# Patient Record
Sex: Male | Born: 1985
Health system: Southern US, Community
[De-identification: ages and names within clinical notes are randomized; demographics above are authoritative.]

## PROBLEM LIST (undated history)

## (undated) DIAGNOSIS — M26629 Arthralgia of temporomandibular joint, unspecified side: Secondary | ICD-10-CM

## (undated) DIAGNOSIS — R112 Nausea with vomiting, unspecified: Secondary | ICD-10-CM

## (undated) DIAGNOSIS — Z9889 Other specified postprocedural states: Secondary | ICD-10-CM

## (undated) DIAGNOSIS — F419 Anxiety disorder, unspecified: Secondary | ICD-10-CM

## (undated) HISTORY — PX: HERNIA REPAIR: SHX51

## (undated) HISTORY — DX: Anxiety disorder, unspecified: F41.9

## (undated) HISTORY — DX: Arthralgia of temporomandibular joint, unspecified side: M26.629

## (undated) HISTORY — PX: TYMPANOSTOMY TUBE PLACEMENT: SHX32

---
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
## 2012-09-08 DIAGNOSIS — M26629 Arthralgia of temporomandibular joint, unspecified side: Secondary | ICD-10-CM

## 2012-09-08 HISTORY — DX: Arthralgia of temporomandibular joint, unspecified side: M26.629

## 2012-11-15 ENCOUNTER — Ambulatory Visit: Payer: Self-pay | Admitting: Internal Medicine

## 2013-01-03 ENCOUNTER — Other Ambulatory Visit (INDEPENDENT_AMBULATORY_CARE_PROVIDER_SITE_OTHER): Payer: PRIVATE HEALTH INSURANCE

## 2013-01-03 ENCOUNTER — Encounter: Payer: Self-pay | Admitting: Internal Medicine

## 2013-01-03 ENCOUNTER — Ambulatory Visit (INDEPENDENT_AMBULATORY_CARE_PROVIDER_SITE_OTHER): Payer: PRIVATE HEALTH INSURANCE | Admitting: Internal Medicine

## 2013-01-03 VITALS — BP 120/64 | HR 88 | Temp 97.8°F | Resp 16 | Ht 71.0 in | Wt 143.0 lb

## 2013-01-03 DIAGNOSIS — Z0000 Encounter for general adult medical examination without abnormal findings: Secondary | ICD-10-CM

## 2013-01-03 DIAGNOSIS — G2581 Restless legs syndrome: Secondary | ICD-10-CM

## 2013-01-03 DIAGNOSIS — M26609 Unspecified temporomandibular joint disorder, unspecified side: Secondary | ICD-10-CM | POA: Insufficient documentation

## 2013-01-03 DIAGNOSIS — A6000 Herpesviral infection of urogenital system, unspecified: Secondary | ICD-10-CM

## 2013-01-03 DIAGNOSIS — A6002 Herpesviral infection of other male genital organs: Secondary | ICD-10-CM

## 2013-01-03 DIAGNOSIS — M26629 Arthralgia of temporomandibular joint, unspecified side: Secondary | ICD-10-CM

## 2013-01-03 LAB — COMPREHENSIVE METABOLIC PANEL
ALT: 16 U/L (ref 0–53)
Alkaline Phosphatase: 57 U/L (ref 39–117)
Sodium: 141 mEq/L (ref 135–145)
Total Bilirubin: 0.7 mg/dL (ref 0.3–1.2)
Total Protein: 6.9 g/dL (ref 6.0–8.3)

## 2013-01-03 LAB — CBC WITH DIFFERENTIAL/PLATELET
Basophils Absolute: 0 10*3/uL (ref 0.0–0.1)
Eosinophils Absolute: 0.2 10*3/uL (ref 0.0–0.7)
HCT: 41.7 % (ref 39.0–52.0)
Lymphs Abs: 1.4 10*3/uL (ref 0.7–4.0)
MCHC: 34.6 g/dL (ref 30.0–36.0)
MCV: 86.5 fl (ref 78.0–100.0)
Monocytes Absolute: 0.4 10*3/uL (ref 0.1–1.0)
Platelets: 232 10*3/uL (ref 150.0–400.0)
RDW: 12.3 % (ref 11.5–14.6)

## 2013-01-03 LAB — LIPID PANEL
HDL: 65.6 mg/dL (ref >39.00)
Total CHOL/HDL Ratio: 2
Triglycerides: 85 mg/dL (ref 0.0–149.0)
VLDL: 17 mg/dL (ref 0.0–40.0)

## 2013-01-03 MED ORDER — CLONAZEPAM 1 MG PO TABS: 1.0000 mg | ORAL_TABLET | Freq: Every evening | ORAL | Status: DC | PRN

## 2013-01-03 MED ORDER — VALACYCLOVIR HCL 500 MG PO TABS: 500.0000 mg | ORAL_TABLET | Freq: Every day | ORAL | Status: DC

## 2013-01-03 NOTE — Assessment & Plan Note (Signed)
I will check his HSV titers but I feel like he does have frequent, painful outbreaks so I have advised him to take a daily dose of valtrex

## 2013-01-03 NOTE — Patient Instructions (Signed)
Health Maintenance, Males A healthy lifestyle and preventative care can promote health and wellness.  Maintain regular health, dental, and eye exams.  Eat a healthy diet. Foods like vegetables, fruits, whole grains, low-fat dairy products, and lean protein foods contain the nutrients you need without too many calories. Decrease your intake of foods high in solid fats, added sugars, and salt. Get information about a proper diet from your caregiver, if necessary.  Regular physical exercise is one of the most important things you can do for your health. Most adults should get at least 150 minutes of moderate-intensity exercise (any activity that increases your heart rate and causes you to sweat) each week. In addition, most adults need muscle-strengthening exercises on 2 or more days a week.   Maintain a healthy weight. The body mass index (BMI) is a screening tool to identify possible weight problems. It provides an estimate of body fat based on height and weight. Your caregiver can help determine your BMI, and can help you achieve or maintain a healthy weight. For adults 20 years and older:  A BMI below 18.5 is considered underweight.  A BMI of 18.5 to 24.9 is normal.  A BMI of 25 to 29.9 is considered overweight.  A BMI of 30 and above is considered obese.  Maintain normal blood lipids and cholesterol by exercising and minimizing your intake of saturated fat. Eat a balanced diet with plenty of fruits and vegetables. Blood tests for lipids and cholesterol should begin at age 20 and be repeated every 5 years. If your lipid or cholesterol levels are high, you are over 50, or you are a high risk for heart disease, you may need your cholesterol levels checked more frequently.Ongoing high lipid and cholesterol levels should be treated with medicines, if diet and exercise are not effective.  If you smoke, find out from your caregiver how to quit. If you do not use tobacco, do not start.  If you  choose to drink alcohol, do not exceed 2 drinks per day. One drink is considered to be 12 ounces (355 mL) of beer, 5 ounces (148 mL) of wine, or 1.5 ounces (44 mL) of liquor.  Avoid use of street drugs. Do not share needles with anyone. Ask for help if you need support or instructions about stopping the use of drugs.  High blood pressure causes heart disease and increases the risk of stroke. Blood pressure should be checked at least every 1 to 2 years. Ongoing high blood pressure should be treated with medicines if weight loss and exercise are not effective.  If you are 45 to 28 years old, ask your caregiver if you should take aspirin to prevent heart disease.  Diabetes screening involves taking a blood sample to check your fasting blood sugar level. This should be done once every 3 years, after age 45, if you are within normal weight and without risk factors for diabetes. Testing should be considered at a younger age or be carried out more frequently if you are overweight and have at least 1 risk factor for diabetes.  Colorectal cancer can be detected and often prevented. Most routine colorectal cancer screening begins at the age of 50 and continues through age 75. However, your caregiver may recommend screening at an earlier age if you have risk factors for colon cancer. On a yearly basis, your caregiver may provide home test kits to check for hidden blood in the stool. Use of a small camera at the end of a tube,   to directly examine the colon (sigmoidoscopy or colonoscopy), can detect the earliest forms of colorectal cancer. Talk to your caregiver about this at age 50, when routine screening begins. Direct examination of the colon should be repeated every 5 to 10 years through age 75, unless early forms of pre-cancerous polyps or small growths are found.  Hepatitis C blood testing is recommended for all people born from 1945 through 1965 and any individual with known risks for hepatitis C.  Healthy  men should no longer receive prostate-specific antigen (PSA) blood tests as part of routine cancer screening. Consult with your caregiver about prostate cancer screening.  Testicular cancer screening is not recommended for adolescents or adult males who have no symptoms. Screening includes self-exam, caregiver exam, and other screening tests. Consult with your caregiver about any symptoms you have or any concerns you have about testicular cancer.  Practice safe sex. Use condoms and avoid high-risk sexual practices to reduce the spread of sexually transmitted infections (STIs).  Use sunscreen with a sun protection factor (SPF) of 30 or greater. Apply sunscreen liberally and repeatedly throughout the day. You should seek shade when your shadow is shorter than you. Protect yourself by wearing long sleeves, pants, a wide-brimmed hat, and sunglasses year round, whenever you are outdoors.  Notify your caregiver of new moles or changes in moles, especially if there is a change in shape or color. Also notify your caregiver if a mole is larger than the size of a pencil eraser.  A one-time screening for abdominal aortic aneurysm (AAA) and surgical repair of large AAAs by sound wave imaging (ultrasonography) is recommended for ages 65 to 75 years who are current or former smokers.  Stay current with your immunizations. Document Released: 02/21/2008 Document Revised: 11/17/2011 Document Reviewed: 01/20/2011 [employer] Patient Information 2013 [employer].  

## 2013-01-03 NOTE — Assessment & Plan Note (Signed)
Continue klonopin 

## 2013-01-03 NOTE — Assessment & Plan Note (Signed)
Exam done Vaccines were reviewed Labs ordered Pt ed material was given 

## 2013-01-03 NOTE — Assessment & Plan Note (Signed)
He will continue aleve as needed

## 2013-01-03 NOTE — Progress Notes (Signed)
Subjective:    Patient ID: Evan Lee, male    DOB: 03/08/1986, 27 y.o.   MRN: [id number]  HPI  New to me he comes in for a physical. He has a history of restless legs syndrome and bruxism and he requests a refill on klonopin, he has been seeing a dentist and he plans to get a nightguard made soon. He sometimes awakens with aching in his right TMJ for which he takes aleve and that helps a lot. He also tells me about a 2-3 years history of painful rash in his groin that he suspects his HSV infection. He has outbreaks as frequently as monthly and he tells me that the outbreaks are quite painful.  Review of Systems  Constitutional: Negative.  Negative for fever, chills, diaphoresis, activity change, appetite change, fatigue and unexpected weight change.  HENT: Negative.   Eyes: Negative.   Respiratory: Negative.   Cardiovascular: Negative.   Gastrointestinal: Negative.  Negative for abdominal pain.  Endocrine: Negative.   Genitourinary: Negative.  Negative for dysuria, decreased urine volume, discharge, scrotal swelling, difficulty urinating, genital sores, penile pain and testicular pain.  Musculoskeletal: Negative.   Skin: Negative.  Negative for color change, pallor, rash and wound.  Allergic/Immunologic: Negative.   Neurological: Negative.   Hematological: Negative.  Negative for adenopathy. Does not bruise/bleed easily.  Psychiatric/Behavioral: Positive for sleep disturbance. Negative for suicidal ideas, hallucinations, behavioral problems, confusion, self-injury, dysphoric mood, decreased concentration and agitation. The patient is nervous/anxious. The patient is not hyperactive.        Objective:   Physical Exam  Vitals reviewed. Constitutional: He is oriented to person, place, and time. He appears well-developed and well-nourished. No distress.  HENT:  Head: Normocephalic and atraumatic.  Mouth/Throat: Oropharynx is clear and moist. No oropharyngeal exudate.  Eyes:  Conjunctivae are normal. Right eye exhibits no discharge. Left eye exhibits no discharge. No scleral icterus.  Neck: Normal range of motion. Neck supple. No JVD present. No tracheal deviation present. No thyromegaly present.  Cardiovascular: Normal rate, regular rhythm, normal heart sounds and intact distal pulses.  Exam reveals no gallop and no friction rub.   No murmur heard. Pulmonary/Chest: Effort normal and breath sounds normal. No stridor. No respiratory distress. He has no wheezes. He has no rales. He exhibits no tenderness.  Abdominal: Soft. Bowel sounds are normal. He exhibits no distension and no mass. There is no tenderness. There is no rebound and no guarding. Hernia confirmed negative in the right inguinal area and confirmed negative in the left inguinal area.  Genitourinary: Testes normal and penis normal. Right testis shows no mass, no swelling and no tenderness. Right testis is descended. Left testis shows no mass, no swelling and no tenderness. Left testis is descended. Circumcised. No penile erythema or penile tenderness. No discharge found.  Musculoskeletal: Normal range of motion. He exhibits no edema and no tenderness.  Lymphadenopathy:    He has no cervical adenopathy.       Right: No inguinal adenopathy present.       Left: No inguinal adenopathy present.  Neurological: He is oriented to person, place, and time.  Skin: Skin is warm and dry. No rash noted. He is not diaphoretic. No erythema. No pallor.  Psychiatric: He has a normal mood and affect. His behavior is normal. Judgment and thought content normal.     No results found for this basename: WBC, HGB, HCT, PLT, GLUCOSE, CHOL, TRIG, HDL, LDLDIRECT, LDLCALC, ALT, AST, NA, K, CL, CREATININE, BUN, CO2, TSH,  PSA, INR, GLUF, HGBA1C, MICROALBUR       Assessment & Plan:

## 2013-01-04 ENCOUNTER — Encounter: Payer: Self-pay | Admitting: Internal Medicine

## 2013-04-24 ENCOUNTER — Encounter: Payer: Self-pay | Admitting: Internal Medicine

## 2013-04-25 ENCOUNTER — Other Ambulatory Visit: Payer: Self-pay | Admitting: Internal Medicine

## 2013-04-25 ENCOUNTER — Encounter: Payer: Self-pay | Admitting: Internal Medicine

## 2013-04-25 DIAGNOSIS — A6002 Herpesviral infection of other male genital organs: Secondary | ICD-10-CM

## 2013-04-25 MED ORDER — VALACYCLOVIR HCL 1 G PO TABS: 1000.0000 mg | ORAL_TABLET | Freq: Three times a day (TID) | ORAL | Status: AC

## 2013-06-17 ENCOUNTER — Encounter: Payer: Self-pay | Admitting: Internal Medicine

## 2013-06-29 ENCOUNTER — Ambulatory Visit: Payer: Self-pay | Admitting: Internal Medicine

## 2013-07-11 ENCOUNTER — Encounter: Payer: Self-pay | Admitting: Internal Medicine

## 2013-07-14 ENCOUNTER — Other Ambulatory Visit: Payer: Self-pay

## 2013-08-03 ENCOUNTER — Telehealth: Payer: Self-pay | Admitting: *Deleted

## 2013-08-03 NOTE — Telephone Encounter (Signed)
Spoke with pt and pharmacy advised of MDs message

## 2013-08-03 NOTE — Telephone Encounter (Signed)
[facility] requesting Clonazepam Refill.  Please advise

## 2013-08-03 NOTE — Telephone Encounter (Signed)
He needs to be seen for this

## 2013-11-13 ENCOUNTER — Encounter: Payer: Self-pay | Admitting: Internal Medicine

## 2013-11-14 ENCOUNTER — Other Ambulatory Visit: Payer: Self-pay | Admitting: Internal Medicine

## 2013-11-14 MED ORDER — SILVER SULFADIAZINE 1 % EX CREA: 1.0000 "application " | TOPICAL_CREAM | Freq: Every day | CUTANEOUS | Status: DC

## 2013-11-17 ENCOUNTER — Encounter: Payer: Self-pay | Admitting: Internal Medicine

## 2013-11-17 ENCOUNTER — Other Ambulatory Visit: Payer: Self-pay | Admitting: Internal Medicine

## 2013-11-17 MED ORDER — SILVER SULFADIAZINE 1 % EX CREA: 1.0000 "application " | TOPICAL_CREAM | Freq: Every day | CUTANEOUS | Status: DC

## 2015-07-12 ENCOUNTER — Emergency Department (HOSPITAL_COMMUNITY)
Admission: EM | Admit: 2015-07-12 | Discharge: 2015-07-12 | Disposition: A | Discharge status: Home / Self Care | Payer: 59 | Source: Home / Self Care | Attending: Family Medicine | Admitting: Family Medicine

## 2015-07-12 ENCOUNTER — Encounter (HOSPITAL_COMMUNITY): Payer: Self-pay | Admitting: *Deleted

## 2015-07-12 DIAGNOSIS — J069 Acute upper respiratory infection, unspecified: Secondary | ICD-10-CM | POA: Diagnosis not present

## 2015-07-12 MED ORDER — DOXYCYCLINE HYCLATE 100 MG PO CAPS: 100.0000 mg | ORAL_CAPSULE | Freq: Two times a day (BID) | ORAL | Status: DC

## 2015-07-12 MED ORDER — IPRATROPIUM BROMIDE 0.06 % NA SOLN: 2.0000 | Freq: Four times a day (QID) | NASAL | Status: DC

## 2015-07-12 NOTE — ED Notes (Signed)
Pt  Reports   Symptoms   Of  Cough  /   Congestion             he  Has    A  Cough  And  Is  Hoarse  The     Reports  The  Symptoms for  About  1  Week       He  Reports  The  Symptoms  Not  releived  By  [facility]  meds

## 2015-07-12 NOTE — ED Provider Notes (Signed)
CSN: [id number]     Arrival date & time 07/12/15  1307 History   First MD Initiated Contact with Patient 07/12/15 1432     Chief Complaint  Patient presents with  . URI   (Consider location/radiation/quality/duration/timing/severity/associated sxs/prior Treatment) Patient is a 29 y.o. male presenting with cough. The history is provided by the patient.  Cough Cough characteristics:  Productive Sputum characteristics:  Yellow and green Severity:  Mild Onset quality:  Gradual Duration:  1 week Progression:  Unchanged Chronicity:  New Smoker: no   Context: upper respiratory infection and weather changes   Relieved by:  None tried Worsened by:  Nothing tried Associated symptoms: rhinorrhea   Associated symptoms: no chills, no fever, no rash, no shortness of breath, no sinus congestion and no sore throat     Past Medical History  Diagnosis Date  . TMJ arthralgia 2014    + bruxism per his dentist  . RLS (restless legs syndrome) 2012    treated with klonopin  . HSV-2 infection 2011   Past Surgical History  Procedure Laterality Date  . Anterior cruciate ligament repair  2002   Family History  Problem Relation Age of Onset  . Arthritis Other   . Hypertension Other   . Hypertension Father   . Cancer Neg Hx   . Diabetes Neg Hx   . Early death Neg Hx   . Drug abuse Neg Hx   . Heart disease Neg Hx   . Hyperlipidemia Neg Hx   . Kidney disease Neg Hx   . Stroke Neg Hx    Social History  Substance Use Topics  . Smoking status: Never Smoker   . Smokeless tobacco: None  . Alcohol Use: 3.0 oz/week    5 Cans of beer per week    Review of Systems  Constitutional: Negative for fever and chills.  HENT: Positive for congestion and rhinorrhea. Negative for sore throat.   Respiratory: Positive for cough. Negative for shortness of breath.   Cardiovascular: Negative.   Genitourinary: Negative.   Musculoskeletal: Negative.   Skin: Negative.  Negative for rash.    Allergies   Review of patient's allergies indicates no known allergies.  Home Medications   Prior to Admission medications   Medication Sig Start Date End Date Taking? Authorizing Provider  clonazePAM (KLONOPIN) 1 MG tablet Take 1 tablet (1 mg total) by mouth at bedtime as needed for anxiety. 01/03/13   Janith Lima, MD  doxycycline (VIBRAMYCIN) 100 MG capsule Take 1 capsule (100 mg total) by mouth 2 (two) times daily. 07/12/15   Billy Fischer, MD  ipratropium (ATROVENT) 0.06 % nasal spray Place 2 sprays into both nostrils 4 (four) times daily. 07/12/15   Billy Fischer, MD  silver sulfADIAZINE (SILVADENE) 1 % cream Apply 1 application topically daily. 11/17/13   Janith Lima, MD   Meds Ordered and Administered this Visit  Medications - No data to display  There were no vitals taken for this visit. No data found.   Physical Exam  Constitutional: He is oriented to person, place, and time. He appears well-developed and well-nourished. No distress.  HENT:  Right Ear: External ear normal.  Left Ear: External ear normal.  Nose: Rhinorrhea present.  Mouth/Throat: Oropharynx is clear and moist.  Neck: Normal range of motion. Neck supple.  Cardiovascular: Normal rate, regular rhythm, normal heart sounds and intact distal pulses.   Pulmonary/Chest: Effort normal and breath sounds normal.  Lymphadenopathy:    He has no  cervical adenopathy.  Neurological: He is alert and oriented to person, place, and time.  Skin: Skin is warm and dry.  Nursing note and vitals reviewed.   ED Course  Procedures (including critical care time)  Labs Review Labs Reviewed - No data to display  Imaging Review No results found.   Visual Acuity Review  Right Eye Distance:   Left Eye Distance:   Bilateral Distance:    Right Eye Near:   Left Eye Near:    Bilateral Near:         MDM   1. URI (upper respiratory infection)        Billy Fischer, MD 07/20/15 2046

## 2015-07-13 ENCOUNTER — Encounter (HOSPITAL_COMMUNITY): Payer: Self-pay | Admitting: *Deleted

## 2016-02-07 DIAGNOSIS — J3089 Other allergic rhinitis: Secondary | ICD-10-CM | POA: Insufficient documentation

## 2016-03-26 ENCOUNTER — Ambulatory Visit (INDEPENDENT_AMBULATORY_CARE_PROVIDER_SITE_OTHER): Payer: BLUE CROSS/BLUE SHIELD | Admitting: Family Medicine

## 2016-03-26 ENCOUNTER — Encounter: Payer: Self-pay | Admitting: Family Medicine

## 2016-03-26 VITALS — BP 120/76 | HR 69 | Ht 71.0 in | Wt 149.0 lb

## 2016-03-26 DIAGNOSIS — A6002 Herpesviral infection of other male genital organs: Secondary | ICD-10-CM

## 2016-03-26 DIAGNOSIS — R7301 Impaired fasting glucose: Secondary | ICD-10-CM

## 2016-03-26 DIAGNOSIS — Z23 Encounter for immunization: Secondary | ICD-10-CM | POA: Diagnosis not present

## 2016-03-26 DIAGNOSIS — G2581 Restless legs syndrome: Secondary | ICD-10-CM

## 2016-03-26 DIAGNOSIS — A609 Anogenital herpesviral infection, unspecified: Secondary | ICD-10-CM

## 2016-03-26 DIAGNOSIS — H6981 Other specified disorders of Eustachian tube, right ear: Secondary | ICD-10-CM

## 2016-03-26 DIAGNOSIS — F411 Generalized anxiety disorder: Secondary | ICD-10-CM

## 2016-03-26 MED ORDER — FLUTICASONE PROPIONATE 50 MCG/ACT NA SUSP
NASAL | Status: DC
Start: 2016-03-26 — End: 2017-10-13

## 2016-03-26 NOTE — Progress Notes (Signed)
Evan Lee, D.O. Primary care at [facility]:    Chief Complaint  Patient presents with  . Establish Care  . Ear Fullness   New pt, here to establish care.   HPI: Evan Lee is a pleasant 30 y.o. male who presents to [facility] today To become established. Prior PCP was Dr. Scarlette Calico.   No doc since pre-college.   Moved here 5 yrs ago.  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    5 d wk, 5am- 3pm;  7a- 3 pm.   Used to work out in college (4 yr degree)  but hasn't for some time now.  His mom lives in [place] and patient is married. Wife is pregnant at [redacted] weeks with her first child.     R ear- feels clogged/ like there is a fullness and occasionally ringing in it.   Feels like he flew and if he blows his nose, he will break the pressure in the ear.  Tries to clean it out himself, uses q-tips.   Has had restless leg syndrome for sev yrs now. Not an active issue but has taken Klonopin's in the past since 2014 only if symptoms got bad.      Past Medical History:  Diagnosis Date  . Elevated fasting glucose 03/26/2016  . Herpes genitalis in men 01/03/2013  . TMJ arthralgia 2014   + bruxism per his dentist      Past Surgical History:  Procedure Laterality Date  . ANTERIOR CRUCIATE LIGAMENT REPAIR  2002      Family History  Problem Relation Age of Onset  . Arthritis Other   . Hypertension Other   . Hypertension Father   . Cancer Neg Hx   . Early death Neg Hx   . Drug abuse Neg Hx   . Heart disease Neg Hx   . Hyperlipidemia Neg Hx   . Kidney disease Neg Hx   . Stroke Neg Hx   . Diabetes Maternal Grandmother       History  Drug Use No     History  Alcohol Use  . 3.0 oz/week  . 5 Cans of beer per week     History  Smoking Status  . Former Smoker  . Packs/day: 0.05  . Years: 1.00  Smokeless Tobacco  . Never Used       History  Sexual Activity  .  Sexual activity: Yes      Patient's Medications  New Prescriptions   FLUTICASONE (FLONASE) 50 MCG/ACT NASAL SPRAY    1 spray each nares twice daily or 2 sprays each nostril once daily  Previous Medications   No medications on file  Modified Medications   No medications on file  Discontinued Medications   CLONAZEPAM (KLONOPIN) 1 MG TABLET    Take 1 tablet (1 mg total) by mouth at bedtime as needed for anxiety.   DOXYCYCLINE (VIBRAMYCIN) 100 MG CAPSULE    Take 1 capsule (100 mg total) by mouth 2 (two) times daily.   IPRATROPIUM (ATROVENT) 0.06 % NASAL SPRAY    Place 2 sprays into both nostrils 4 (four) times daily.   SILVER SULFADIAZINE (SILVADENE) 1 % CREAM    Apply 1 application topically daily.     Review of patient's allergies indicates no known allergies. Outpatient Encounter Prescriptions as of 03/26/2016  Medication Sig  . fluticasone (FLONASE) 50  MCG/ACT nasal spray 1 spray each nares twice daily or 2 sprays each nostril once daily  . [DISCONTINUED] clonazePAM (KLONOPIN) 1 MG tablet Take 1 tablet (1 mg total) by mouth at bedtime as needed for anxiety.  . [DISCONTINUED] doxycycline (VIBRAMYCIN) 100 MG capsule Take 1 capsule (100 mg total) by mouth 2 (two) times daily.  . [DISCONTINUED] ipratropium (ATROVENT) 0.06 % nasal spray Place 2 sprays into both nostrils 4 (four) times daily.  . [DISCONTINUED] silver sulfADIAZINE (SILVADENE) 1 % cream Apply 1 application topically daily.   No facility-administered encounter medications on file as of 03/26/2016.       There are no preventive care reminders to display for this patient.   Immunization History  Administered Date(s) Administered  . Tdap 04/16/2005, 03/26/2016     <no information>   No flowsheet data found.   No flowsheet data found.    Review of Systems:   ( Completed via Adult Medical History Intake form today ) General:   Denies fever, chills, appetite changes, unexplained weight loss.  Optho/Auditory:    Denies visual changes, blurred vision/LOV, ringing in ears/ diff hearing Respiratory:   Denies SOB, DOE, cough, wheezing.  Cardiovascular:   Denies chest pain, palpitations, new onset peripheral edema  Gastrointestinal:   Denies nausea, vomiting, diarrhea.  Genitourinary:    Denies dysuria, increased frequency, flank pain.  Endocrine:     Denies hot or cold intolerance, polyuria, polydipsia. Musculoskeletal:  Denies unexplained myalgias, joint swelling, arthralgias, gait problems.  Skin:  Denies rash, suspicious lesions or new/ changes in moles Neurological:    Denies dizziness, syncope, unexplained weakness, lightheadedness, numbness  Psychiatric/Behavioral:   Denies mood changes, suicidal or homicidal ideations, hallucinations    Objective:   Blood pressure 145/97, pulse 69, height 5\' 11"  (1.803 m), weight 149 lb (67.586 kg), SpO2 100 %. Body mass index is 20.78 kg/m.  General: Well Developed, well nourished, and in no acute distress.  Neuro: Alert and oriented x3, extra-ocular muscles intact, sensation grossly intact.  HEENT: [place]/AT, no LAD, right TM mild bulge, no effusion, EAC clear and TM translucent, no TTP sinuses, Nares- patent but mild erythema and edematous on right. Skin: no gross suspicious lesions or rashes  Cardiac: Regular rate and rhythm, no murmurs rubs or gallops.  Respiratory: Essentially clear to auscultation bilaterally. Not using accessory muscles, speaking in full sentences.  Abdominal: Soft, not grossly distended Musculoskeletal: Ambulates w/o diff, FROM * 4 ext.  Vasc: less 2 sec cap RF, warm and pink  Psych:  No HI/SI, judgement and insight good.    Impression and Recommendations:      1. ETD (eustachian tube dysfunction), right   2. RLS (restless legs syndrome)   3. Elevated fasting glucose   4. h/o Anxiety state w diff sleeping   5. Herpes genitalis in men   6. Need for Tdap vaccination    The patient was counseled, risk factors were discussed,  anticipatory guidance given.  - ETD- handouts provided after discussion, all patient's questions answered. Flonase daily, may use Milta Deiters med sinus rinse twice a day, especially after working in [employer] with all the loose flour/allergens in the air  - He declines the need for medication refills at this time.  - Told to make office visit for fasting blood work near future.     Meds ordered this encounter  Medications  . fluticasone (FLONASE) 50 MCG/ACT nasal spray    Sig: 1 spray each nares twice daily or 2 sprays each nostril  once daily    Dispense:  1 g    Refill:  2    Gross side effects, risk and benefits, and alternatives of medications discussed with patient.  Patient is aware that all medications have potential side effects and we are unable to predict every side effect or drug-drug interaction that may occur.  Expresses verbal understanding and consents to current therapy plan and treatment regimen.  Please see AVS handed out to patient at the end of our visit for further patient instructions/ counseling done pertaining to today's office visit.  Orders Placed This Encounter  Procedures  . Tdap vaccine greater than or equal to 7yo IM  . CBC with Differential/Platelet    Standing Status:   Future    Standing Expiration Date:   03/26/2017  . COMPLETE METABOLIC PANEL WITH GFR    Standing Status:   Future    Standing Expiration Date:   03/26/2017  . Lipid panel    Standing Status:   Future    Standing Expiration Date:   03/26/2017    Order Specific Question:   Has the patient fasted?    Answer:   Yes  . Magnesium    Standing Status:   Future    Standing Expiration Date:   03/26/2017  . Phosphorus    Standing Status:   Future    Standing Expiration Date:   03/26/2017  . VITAMIN D 25 Hydroxy (Vit-D Deficiency, Fractures)    Standing Status:   Future    Standing Expiration Date:   03/26/2017  . Hemoglobin A1c    Standing Status:   Future    Standing Expiration Date:   03/26/2017  .  TSH    Standing Status:   Future    Standing Expiration Date:   03/26/2017     Note: This document was prepared using Dragon voice recognition software and may include unintentional dictation errors.

## 2016-03-26 NOTE — Patient Instructions (Signed)
Evan Lee med sinus rinses twice daily. Especially if you're in a dusty situation or with a lot of pollen or outdoors for long period time when you get inside make sure you flush her nose out.     Fluticasone nasal spray What is this medicine? FLUTICASONE (floo TIK a sone) is a corticosteroid. This medicine is used to treat the symptoms of allergies like sneezing, itchy red eyes, and itchy, runny, or stuffy nose. This medicine may be used for other purposes; ask your health care provider or pharmacist if you have questions. What should I tell my health care provider before I take this medicine? They need to know if you have any of these conditions: -infection, like tuberculosis, herpes, or fungal infection -recent surgery on nose or sinuses -taking corticosteroid by mouth -an unusual or allergic reaction to fluticasone, steroids, other medicines, foods, dyes, or preservatives -pregnant or trying to get pregnant -breast-feeding How should I use this medicine? This medicine is for use in the nose. Follow the directions on your product or prescription label. This medicine works best if used at regular intervals. Do not use more often than directed. Make sure that you are using your nasal spray correctly. After 6 months of daily use without a prescription, talk to your doctor or health care professional before using it for a longer time. Ask your doctor or health care professional if you have any questions. Talk to your pediatrician regarding the use of this medicine in children. Special care may be needed. This medicine has been used in children as young as 2 years. After two months of daily use without a prescription in a child, talk to your pediatrician before using it for a longer time. Overdosage: If you think you have taken too much of this medicine contact a poison control center or emergency room at once. NOTE: This medicine is only for you. Do not share this medicine with others. What if I miss  a dose? If you miss a dose, use it as soon as you remember. If it is almost time for your next dose, use only that dose and continue with your regular schedule. Do not use double or extra doses. What may interact with this medicine? -ketoconazole -metyrapone -some medicines for HIV -vaccines This list may not describe all possible interactions. Give your health care provider a list of all the medicines, herbs, non-prescription drugs, or dietary supplements you use. Also tell them if you smoke, drink alcohol, or use illegal drugs. Some items may interact with your medicine. What should I watch for while using this medicine? Visit your doctor or health care professional for regular checks on your progress. Some symptoms may improve within 12 hours after starting use. Check with your doctor or health care professional if there is no improvement in your condition after 3 weeks of use. Do not come in contact with people who have chickenpox or the measles while you are taking this medicine. If you do, call your doctor right away. What side effects may I notice from receiving this medicine? Side effects that you should report to your doctor or health care professional as soon as possible: -allergic reactions like skin rash, itching or hives, swelling of the face, lips, or tongue -changes in vision -flu-like symptoms -white patches or sores in the mouth or nose Side effects that usually do not require medical attention (report to your doctor or health care professional if they continue or are bothersome): -burning or irritation inside the nose or  throat -cough -headache -nosebleed -unusual taste or smell This list may not describe all possible side effects. Call your doctor for medical advice about side effects. You may report side effects to FDA at 1-800-FDA-1088. Where should I keep my medicine? Keep out of the reach of children. Store at room temperature between 15 and 30 degrees C (59 and 86  degrees F). Throw away any unused medicine after the expiration date. NOTE: This sheet is a summary. It may not cover all possible information. If you have questions about this medicine, talk to your doctor, pharmacist, or health care provider.    2016, Elsevier/Gold Standard. (2014-01-18 09:07:53)

## 2016-03-30 ENCOUNTER — Encounter: Payer: Self-pay | Admitting: Family Medicine

## 2016-03-30 DIAGNOSIS — F411 Generalized anxiety disorder: Secondary | ICD-10-CM | POA: Insufficient documentation

## 2016-03-31 ENCOUNTER — Other Ambulatory Visit (INDEPENDENT_AMBULATORY_CARE_PROVIDER_SITE_OTHER): Payer: BLUE CROSS/BLUE SHIELD

## 2016-03-31 DIAGNOSIS — R7301 Impaired fasting glucose: Secondary | ICD-10-CM

## 2016-03-31 DIAGNOSIS — Z23 Encounter for immunization: Secondary | ICD-10-CM

## 2016-03-31 DIAGNOSIS — H6981 Other specified disorders of Eustachian tube, right ear: Secondary | ICD-10-CM

## 2016-03-31 DIAGNOSIS — G2581 Restless legs syndrome: Secondary | ICD-10-CM

## 2016-04-01 LAB — COMPLETE METABOLIC PANEL WITH GFR
ALT: 10 U/L (ref 9–46)
AST: 16 U/L (ref 10–40)
Albumin: 4.5 g/dL (ref 3.6–5.1)
Alkaline Phosphatase: 59 U/L (ref 40–115)
BUN: 14 mg/dL (ref 7–25)
CHLORIDE: 101 mmol/L (ref 98–110)
CO2: 27 mmol/L (ref 20–31)
Calcium: 9.4 mg/dL (ref 8.6–10.3)
Creat: 1 mg/dL (ref 0.60–1.35)
GFR, Est African American: >89 mL/min (ref >=60)
GLUCOSE: 85 mg/dL (ref 65–99)
POTASSIUM: 4.1 mmol/L (ref 3.5–5.3)
SODIUM: 139 mmol/L (ref 135–146)
Total Bilirubin: 0.7 mg/dL (ref 0.2–1.2)
Total Protein: 6.7 g/dL (ref 6.1–8.1)

## 2016-04-01 LAB — CBC WITH DIFFERENTIAL/PLATELET
BASOS ABS: 58 {cells}/uL (ref 0–200)
Basophils Relative: 1 %
EOS PCT: 6 %
Eosinophils Absolute: 348 cells/uL (ref 15–500)
HCT: 41.2 % (ref 38.5–50.0)
Hemoglobin: 13.9 g/dL (ref 13.2–17.1)
Lymphocytes Relative: 31 %
Lymphs Abs: 1798 cells/uL (ref 850–3900)
MCH: 28.7 pg (ref 27.0–33.0)
MCHC: 33.7 g/dL (ref 32.0–36.0)
MCV: 85.1 fL (ref 80.0–100.0)
MONOS PCT: 8 %
MPV: 9.9 fL (ref 7.5–12.5)
Monocytes Absolute: 464 cells/uL (ref 200–950)
NEUTROS ABS: 3132 {cells}/uL (ref 1500–7800)
NEUTROS PCT: 54 %
PLATELETS: 251 10*3/uL (ref 140–400)
RBC: 4.84 MIL/uL (ref 4.20–5.80)
RDW: 13.1 % (ref 11.0–15.0)
WBC: 5.8 10*3/uL (ref 3.8–10.8)

## 2016-04-01 LAB — HEMOGLOBIN A1C
Hgb A1c MFr Bld: 5.2 % (ref <5.7)
MEAN PLASMA GLUCOSE: 103 mg/dL

## 2016-04-01 LAB — LIPID PANEL
CHOL/HDL RATIO: 2.1 ratio (ref <=5.0)
Cholesterol: 147 mg/dL (ref 125–200)
HDL: 71 mg/dL (ref >=40)
LDL CALC: 66 mg/dL (ref <130)
Triglycerides: 51 mg/dL (ref <150)
VLDL: 10 mg/dL (ref <30)

## 2016-04-01 LAB — TSH: TSH: 2.66 mIU/L (ref 0.40–4.50)

## 2016-04-01 LAB — PHOSPHORUS: Phosphorus: 3.9 mg/dL (ref 2.5–4.5)

## 2016-04-01 LAB — VITAMIN D 25 HYDROXY (VIT D DEFICIENCY, FRACTURES): Vit D, 25-Hydroxy: 37 ng/mL (ref 30–100)

## 2016-04-01 LAB — MAGNESIUM: MAGNESIUM: 1.8 mg/dL (ref 1.5–2.5)

## 2016-04-10 ENCOUNTER — Encounter: Payer: Self-pay | Admitting: Family Medicine

## 2016-04-10 ENCOUNTER — Ambulatory Visit (INDEPENDENT_AMBULATORY_CARE_PROVIDER_SITE_OTHER): Payer: BLUE CROSS/BLUE SHIELD | Admitting: Family Medicine

## 2016-04-10 VITALS — BP 126/84 | HR 69 | Wt 150.1 lb

## 2016-04-10 DIAGNOSIS — J3089 Other allergic rhinitis: Secondary | ICD-10-CM | POA: Diagnosis not present

## 2016-04-10 DIAGNOSIS — F411 Generalized anxiety disorder: Secondary | ICD-10-CM | POA: Diagnosis not present

## 2016-04-10 DIAGNOSIS — G2581 Restless legs syndrome: Secondary | ICD-10-CM

## 2016-04-10 DIAGNOSIS — R7301 Impaired fasting glucose: Secondary | ICD-10-CM

## 2016-04-10 MED ORDER — MELATONIN 3 MG SL SUBL: 1.0000 | SUBLINGUAL_TABLET | Freq: Every evening | SUBLINGUAL | Status: DC | PRN

## 2016-04-10 NOTE — Progress Notes (Signed)
Assessment and plan:  1. Environmental and seasonal allergies   2. h/o Anxiety state w diff sleeping   3. Elevated fasting glucose   4. h/o RLS (restless legs syndrome)     h/o RLS (restless legs syndrome) Has had restless leg syndrome for sev yrs now. Not an active issue but has taken Klonopin's in the past since 2014 only if symptoms got bad.   h/o Anxiety state w diff sleeping Symptoms stable. Not currently an issue for patient. Advised to use over-the-counter melatonin sublingual in the future if becomes a problem again  Environmental and seasonal allergies Continue Flonase daily after sinus rinses twice a day  Elevated fasting glucose A1c within normal limits. Counseling on lifestyle modification and diet performed   Patient's Medications  New Prescriptions   MELATONIN 3 MG SUBL    Place 1-3 tablets under the tongue at bedtime as needed.  Previous Medications   FLUTICASONE (FLONASE) 50 MCG/ACT NASAL SPRAY    1 spray each nares twice daily or 2 sprays each nostril once daily  Modified Medications   No medications on file  Discontinued Medications   No medications on file    No Follow-up on file.  Anticipatory guidance and routine counseling done re: condition, txmnt options and need for follow up. All questions of patient's were answered.   Gross side effects, risk and benefits, and alternatives of medications discussed with patient.  Patient is aware that all medications have potential side effects and we are unable to predict every sideeffect or drug-drug interaction that may occur.  Expresses verbal understanding and consents to current therapy plan and treatment regiment.  Please see AVS handed out to patient at the end of our visit for additional patient instructions/ counseling done pertaining to today's office visit.  Note: This document was prepared using Dragon voice recognition software and  may include unintentional dictation errors.   ----------------------------------------------------------------------------------------------------------------------  Subjective:   CC: Evan Lee is a 30 y.o. male who presents to [place] at [facility] today for Follow-up and to discuss recent lab work   Patient was seen and has now started to use Flonase daily. It is helping a lot with the symptoms. He notices the greatest benefit from doing Nettie pot twice daily per our recommendations last office visit.  Reviewed all patient's blood work in full with him; all questions and concerns were answered   Wt Readings from Last 3 Encounters:  04/10/16 150 lb 1.6 oz (68.1 kg)  03/26/16 149 lb (67.6 kg)  01/03/13 143 lb (64.9 kg)   BP Readings from Last 3 Encounters:  04/10/16 126/84  03/26/16 120/76  01/03/13 120/64   Pulse Readings from Last 3 Encounters:  04/10/16 69  03/26/16 69  01/03/13 88    Full medical history updated and reviewed in the office today  Patient Active Problem List   Diagnosis Date Noted  . h/o Anxiety state w diff sleeping 03/30/2016  . Elevated fasting glucose 03/26/2016  . Environmental and seasonal allergies 02/07/2016  . Routine general medical examination at a health care facility 01/03/2013  . Herpes genitalis in men 01/03/2013  . TMJ arthralgia 01/03/2013  . h/o RLS (restless legs syndrome) 01/03/2013    Past Medical History:  Diagnosis Date  . Elevated fasting glucose 03/26/2016  . Herpes genitalis in men 01/03/2013  . TMJ arthralgia 2014   + bruxism per his dentist    Past Surgical History:  Procedure  Laterality Date  . ANTERIOR CRUCIATE LIGAMENT REPAIR  2002    Social History  Substance Use Topics  . Smoking status: Former Smoker    Packs/day: 0.05    Years: 1.00  . Smokeless tobacco: Never Used  . Alcohol use 3.0 oz/week    5 Cans of beer per week    family history includes Arthritis in his  other; Diabetes in his maternal grandmother; Hypertension in his father and other.   Medications: Current Outpatient Prescriptions  Medication Sig Dispense Refill  . fluticasone (FLONASE) 50 MCG/ACT nasal spray 1 spray each nares twice daily or 2 sprays each nostril once daily 1 g 2  . Melatonin 3 MG SUBL Place 1-3 tablets under the tongue at bedtime as needed.     No current facility-administered medications for this visit.     Allergies:  No Known Allergies   ROS:  Const:    no fevers, chills Eyes:    conjunctiva clear, no vision changes or blurred vision ENT:  no hearing difficulties, no dysphagia, no dysphonia, no nose bleeds CV:   no chest pain, arrhythmias, no orthopnea, no PND Pulm:   no SOB at rest or exertion, no Wheeze, no DIB, no hemoptysis GI:    no N/V/D/C, no abd pain GU:   no blood in urine or inc freq or urgency Heme/Onc:    no unexplained bleeding, no night sweats, no more fatigue than usual Neuro:   No dizziness, no LOC, No unexplained weakness or numbness Endo:   no unexplained wt loss or gain M-Sk:   no localized myalgias or arthralgias Psych:    No SI/HI, no memory prob or unexplained confusion    Objective:  Blood pressure 126/84, pulse 69, weight 150 lb 1.6 oz (68.1 kg). Body mass index is 20.93 kg/m. Gen: Well NAD, A and O *3 HEENT: [place]/AT, EOMI,  MMM, OP- clr Lungs: Normal work of breathing. CTA B/L, no Wh, rhonchi Heart: RRR, S1, S2 WNL's, no MRG Abd: Soft. No gross distention Exts: warm, pink,  Brisk capillary refill, warm and well perfused.    Recent Results (from the past 2160 hour(s))  CBC with Differential/Platelet     Status: None   Collection Time: 03/31/16  3:19 PM  Result Value Ref Range   WBC 5.8 3.8 - 10.8 K/uL   RBC 4.84 4.20 - 5.80 MIL/uL   Hemoglobin 13.9 13.2 - 17.1 g/dL   HCT 41.2 38.5 - 50.0 %   MCV 85.1 80.0 - 100.0 fL   MCH 28.7 27.0 - 33.0 pg   MCHC 33.7 32.0 - 36.0 g/dL   RDW 13.1 11.0 - 15.0 %   Platelets 251 140 -  400 K/uL   MPV 9.9 7.5 - 12.5 fL   Neutro Abs 3,132 1,500 - 7,800 cells/uL   Lymphs Abs 1,798 850 - 3,900 cells/uL   Monocytes Absolute 464 200 - 950 cells/uL   Eosinophils Absolute 348 15 - 500 cells/uL   Basophils Absolute 58 0 - 200 cells/uL   Neutrophils Relative % 54 %   Lymphocytes Relative 31 %   Monocytes Relative 8 %   Eosinophils Relative 6 %   Basophils Relative 1 %   Smear Review Criteria for review not met     Comment: ** Please note change in unit of measure and reference range(s). **  COMPLETE METABOLIC PANEL WITH GFR     Status: None   Collection Time: 03/31/16  3:19 PM  Result Value Ref Range  Sodium 139 135 - 146 mmol/L   Potassium 4.1 3.5 - 5.3 mmol/L   Chloride 101 98 - 110 mmol/L   CO2 27 20 - 31 mmol/L   Glucose, Bld 85 65 - 99 mg/dL   BUN 14 7 - 25 mg/dL   Creat 1.00 0.60 - 1.35 mg/dL   Total Bilirubin 0.7 0.2 - 1.2 mg/dL   Alkaline Phosphatase 59 40 - 115 U/L   AST 16 10 - 40 U/L   ALT 10 9 - 46 U/L   Total Protein 6.7 6.1 - 8.1 g/dL   Albumin 4.5 3.6 - 5.1 g/dL   Calcium 9.4 8.6 - 10.3 mg/dL   GFR, Est African American >89 >=60 mL/min   GFR, Est Non African American >89 >=60 mL/min  Lipid panel     Status: None   Collection Time: 03/31/16  3:19 PM  Result Value Ref Range   Cholesterol 147 125 - 200 mg/dL   Triglycerides 51 <150 mg/dL   HDL 71 >=40 mg/dL   Total CHOL/HDL Ratio 2.1 <=5.0 Ratio   VLDL 10 <30 mg/dL   LDL Cholesterol 66 <130 mg/dL    Comment:   Total Cholesterol/HDL Ratio:CHD Risk                        Coronary Heart Disease Risk Table                                        Men       Women          1/2 Average Risk              3.4        3.3              Average Risk              5.0        4.4           2X Average Risk              9.6        7.1           3X Average Risk             23.4       11.0 Use the calculated Patient Ratio above and the CHD Risk table  to determine the patient's CHD Risk.   Magnesium     Status:  None   Collection Time: 03/31/16  3:19 PM  Result Value Ref Range   Magnesium 1.8 1.5 - 2.5 mg/dL  Phosphorus     Status: None   Collection Time: 03/31/16  3:19 PM  Result Value Ref Range   Phosphorus 3.9 2.5 - 4.5 mg/dL  VITAMIN D 25 Hydroxy (Vit-D Deficiency, Fractures)     Status: None   Collection Time: 03/31/16  3:19 PM  Result Value Ref Range   Vit D, 25-Hydroxy 37 30 - 100 ng/mL    Comment: Vitamin D Status           25-OH Vitamin D        Deficiency                <20 ng/mL        Insufficiency         20 - 29 ng/mL  Optimal             > or = 30 ng/mL   For 25-OH Vitamin D testing on patients on D2-supplementation and patients for whom quantitation of D2 and D3 fractions is required, the QuestAssureD 25-OH VIT D, (D2,D3), LC/MS/MS is recommended: order code [phone] [phone] (patients > 2 yrs).   Hemoglobin A1c     Status: None   Collection Time: 03/31/16  3:19 PM  Result Value Ref Range   Hgb A1c MFr Bld 5.2 <5.7 %    Comment:   For the purpose of screening for the presence of diabetes:   <5.7%       Consistent with the absence of diabetes 5.7-6.4 %   Consistent with increased risk for diabetes (prediabetes) >=6.5 %     Consistent with diabetes   This assay result is consistent with a decreased risk of diabetes.   Currently, no consensus exists regarding use of hemoglobin A1c for diagnosis of diabetes in children.   According to American Diabetes Association (ADA) guidelines, hemoglobin A1c <7.0% represents optimal control in non-pregnant diabetic patients. Different metrics may apply to specific patient populations. Standards of Medical Care in Diabetes (ADA).      Mean Plasma Glucose 103 mg/dL  TSH     Status: None   Collection Time: 03/31/16  3:19 PM  Result Value Ref Range   TSH 2.66 0.40 - 4.50 mIU/L

## 2016-04-10 NOTE — Assessment & Plan Note (Addendum)
Symptoms stable. Not currently an issue for patient.  Advised to use OTC melatonin sublingual in the future if becomes a problem again.

## 2016-04-10 NOTE — Assessment & Plan Note (Signed)
Has had restless leg syndrome for sev yrs now. Not an active issue but has taken Klonopin's in the past since 2014 only if symptoms got bad.

## 2016-04-10 NOTE — Assessment & Plan Note (Signed)
Continue Flonase daily after sinus rinses twice a day

## 2016-04-10 NOTE — Assessment & Plan Note (Signed)
A1c within normal limits. Counseling on lifestyle modification and diet performed

## 2017-04-06 ENCOUNTER — Ambulatory Visit: Payer: Self-pay | Admitting: Family Medicine

## 2017-10-13 ENCOUNTER — Ambulatory Visit (INDEPENDENT_AMBULATORY_CARE_PROVIDER_SITE_OTHER): Payer: 59 | Admitting: Adult Health

## 2017-10-13 ENCOUNTER — Encounter: Payer: Self-pay | Admitting: Adult Health

## 2017-10-13 VITALS — BP 116/67 | HR 96 | Temp 99.7°F | Ht 71.0 in | Wt 153.2 lb

## 2017-10-13 DIAGNOSIS — R509 Fever, unspecified: Secondary | ICD-10-CM | POA: Diagnosis not present

## 2017-10-13 DIAGNOSIS — M791 Myalgia, unspecified site: Secondary | ICD-10-CM

## 2017-10-13 LAB — POCT INFLUENZA A/B
Influenza A, POC: POSITIVE — AB
Influenza B, POC: NEGATIVE

## 2017-10-13 MED ORDER — OSELTAMIVIR PHOSPHATE 75 MG PO CAPS: 75.0000 mg | ORAL_CAPSULE | Freq: Two times a day (BID) | ORAL | 0 refills | Status: DC

## 2017-10-13 NOTE — Patient Instructions (Signed)
Influenza, Adult Influenza ("the flu") is an infection in the lungs, nose, and throat (respiratory tract). It is caused by a virus. The flu causes many common cold symptoms, as well as a high fever and body aches. It can make you feel very sick. The flu spreads easily from person to person (is contagious). Getting a flu shot (influenza vaccination) every year is the best way to prevent the flu. Follow these instructions at home:  Take over-the-counter and prescription medicines only as told by your doctor.  Use a cool mist humidifier to add moisture (humidity) to the air in your home. This can make it easier to breathe.  Rest as needed.  Drink enough fluid to keep your pee (urine) clear or pale yellow.  Cover your mouth and nose when you cough or sneeze.  Wash your hands with soap and water often, especially after you cough or sneeze. If you cannot use soap and water, use hand sanitizer.  Stay home from work or school as told by your doctor. Unless you are visiting your doctor, try to avoid leaving home until your fever has been gone for 24 hours without the use of medicine.  Keep all follow-up visits as told by your doctor. This is important. How is this prevented?  Getting a yearly (annual) flu shot is the best way to avoid getting the flu. You may get the flu shot in late summer, fall, or winter. Ask your doctor when you should get your flu shot.  Wash your hands often or use hand sanitizer often.  Avoid contact with people who are sick during cold and flu season.  Eat healthy foods.  Drink plenty of fluids.  Get enough sleep.  Exercise regularly. Contact a doctor if:  You get new symptoms.  You have: ? Chest pain. ? Watery poop (diarrhea). ? A fever.  Your cough gets worse.  You start to have more mucus.  You feel sick to your stomach (nauseous).  You throw up (vomit). Get help right away if:  You start to be short of breath or have trouble breathing.  Your  skin or nails turn a bluish color.  You have very bad pain or stiffness in your neck.  You get a sudden headache.  You get sudden pain in your face or ear.  You cannot stop throwing up. This information is not intended to replace advice given to you by your health care provider. Make sure you discuss any questions you have with your health care provider. Document Released: 06/03/2008 Document Revised: 01/31/2016 Document Reviewed: 06/19/2015 Elsevier Interactive Patient Education  2017 [place] 75mg  twice daily for 5 days. Increase fluids/rest/vit c-2,000mg /day when not feeling well. Alternate OTC Acetaminophen and Ibuprofen as needed. Remain home until fever-free for 24 hrs without medication. FEEL BETTER!

## 2017-10-13 NOTE — Assessment & Plan Note (Signed)
Flu Test + Tamilfu 75mg  twice daily for 5 days. Increase fluids/rest/vit c-2,000mg /day when not feeling well. Alternate OTC Acetaminophen and Ibuprofen as needed. Remain home until fever-free for 24 hrs without medication.

## 2017-10-13 NOTE — Progress Notes (Signed)
Subjective:    Patient ID: Evan Lee, male    DOB: 08-May-1986, 32 y.o.   MRN: [id number]  HPI:  [name] presents with body aches, chills, fever, nausea, non-productive cough, and copious clear nasal drainage that all started this morning.  He has taken OTC "Cold/Flu" medication this morning and been pushing fluids. His father tested + for flu last week. 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  Patient Care Team    Relationship Specialty Notifications Start End  Mellody Dance, DO PCP - General Family Medicine  03/26/16     Patient Active Problem List   Diagnosis Date Noted  . Myalgia 10/13/2017  . Fever 10/13/2017  . h/o Anxiety state w diff sleeping 03/30/2016  . Elevated fasting glucose 03/26/2016  . Environmental and seasonal allergies 02/07/2016  . Routine general medical examination at a health care facility 01/03/2013  . Herpes genitalis in men 01/03/2013  . TMJ arthralgia 01/03/2013  . h/o RLS (restless legs syndrome) 01/03/2013     Past Medical History:  Diagnosis Date  . Elevated fasting glucose 03/26/2016  . Herpes genitalis in men 01/03/2013  . TMJ arthralgia 2014   + bruxism per his dentist     Past Surgical History:  Procedure Laterality Date  . ANTERIOR CRUCIATE LIGAMENT REPAIR  2002     Family History  Problem Relation Age of Onset  . Hypertension Father   . Arthritis Other   . Hypertension Other   . Diabetes Maternal Grandmother   . Cancer Neg Hx   . Early death Neg Hx   . Drug abuse Neg Hx   . Heart disease Neg Hx   . Hyperlipidemia Neg Hx   . Kidney disease Neg Hx   . Stroke Neg Hx      Social History   Substance and Sexual Activity  Drug Use No     Social History   Substance and Sexual Activity  Alcohol Use Yes  . Alcohol/week: 3.0 oz  . Types: 5 Cans of beer per week     Social History   Tobacco Use  Smoking Status Former Smoker  . Packs/day: 0.05  . Years: 1.00  . Pack years: 0.05  Smokeless  Tobacco Never Used     Outpatient Encounter Medications as of 10/13/2017  Medication Sig  . oseltamivir (TAMIFLU) 75 MG capsule Take 1 capsule (75 mg total) by mouth 2 (two) times daily.  . [DISCONTINUED] fluticasone (FLONASE) 50 MCG/ACT nasal spray 1 spray each nares twice daily or 2 sprays each nostril once daily  . [DISCONTINUED] Melatonin 3 MG SUBL Place 1-3 tablets under the tongue at bedtime as needed.   No facility-administered encounter medications on file as of 10/13/2017.     Allergies: Patient has no known allergies.  Body mass index is 21.37 kg/m.  Blood pressure 116/67, pulse 96, temperature 99.7 F (37.6 C), temperature source Oral, height 5\' 11"  (1.803 m), weight 153 lb 3.2 oz (69.5 kg), SpO2 99 %.     Review of Systems  Constitutional: Positive for activity change, appetite change, chills, diaphoresis, fatigue and fever. Negative for unexpected weight change.  HENT: Positive for congestion, postnasal drip and rhinorrhea.   Eyes: Negative for visual disturbance.  Respiratory: Positive for cough. Negative for chest tightness, shortness of breath, wheezing and stridor.   Cardiovascular: Negative for chest pain, palpitations and leg swelling.  Gastrointestinal: Positive for nausea. Negative for abdominal distention, abdominal pain, blood in stool, constipation, diarrhea and vomiting.  Endocrine: Negative for cold intolerance, heat intolerance, polydipsia, polyphagia and polyuria.  Genitourinary: Negative for difficulty urinating and flank pain.  Neurological: Negative for dizziness and headaches.  Hematological: Does not bruise/bleed easily.       Objective:   Physical Exam  Constitutional: He is oriented to person, place, and time. He appears well-developed and well-nourished. He has a sickly appearance.  HENT:  Head: Normocephalic and atraumatic.  Right Ear: Hearing, tympanic membrane, external ear and ear canal normal. Tympanic membrane is not erythematous and  not bulging. No decreased hearing is noted.  Left Ear: External ear normal. Tympanic membrane is not erythematous and not bulging. No decreased hearing is noted.  Nose: Mucosal edema and rhinorrhea present. Right sinus exhibits no maxillary sinus tenderness and no frontal sinus tenderness. Left sinus exhibits no maxillary sinus tenderness and no frontal sinus tenderness.  Mouth/Throat: Uvula is midline and mucous membranes are normal. Posterior oropharyngeal edema and posterior oropharyngeal erythema present. No oropharyngeal exudate or tonsillar abscesses.  Eyes: Conjunctivae are normal. Pupils are equal, round, and reactive to light.  Neck: Normal range of motion. Neck supple.  Cardiovascular: Normal rate, regular rhythm, normal heart sounds and intact distal pulses.  No murmur heard. Pulmonary/Chest: Effort normal and breath sounds normal. No respiratory distress. He has no wheezes. He has no rales. He exhibits no tenderness.  Lymphadenopathy:    He has no cervical adenopathy.  Neurological: He is alert and oriented to person, place, and time.  Skin: Skin is dry. No rash noted. He is not diaphoretic. No erythema. No pallor.  Hot to the touch  Psychiatric: He has a normal mood and affect. His behavior is normal. Judgment and thought content normal.          Assessment & Plan:   1. Myalgia   2. Fever, unspecified fever cause     Myalgia Flu Test + Tamilfu 75mg  twice daily for 5 days. Increase fluids/rest/vit c-2,000mg /day when not feeling well. Alternate OTC Acetaminophen and Ibuprofen as needed. Remain home until fever-free for 24 hrs without medication.  Fever Flu Test + Tamilfu 75mg  twice daily for 5 days. Increase fluids/rest/vit c-2,000mg /day when not feeling well. Alternate OTC Acetaminophen and Ibuprofen as needed. Remain home until fever-free for 24 hrs without medication.    FOLLOW-UP:  Return if symptoms worsen or fail to improve.

## 2018-03-01 ENCOUNTER — Encounter: Payer: Self-pay | Admitting: Adult Health

## 2018-03-01 ENCOUNTER — Ambulatory Visit: Payer: 59 | Admitting: Adult Health

## 2018-03-01 VITALS — BP 107/66 | HR 80 | Temp 98.8°F | Ht 71.0 in | Wt 147.8 lb

## 2018-03-01 DIAGNOSIS — J40 Bronchitis, not specified as acute or chronic: Secondary | ICD-10-CM | POA: Diagnosis not present

## 2018-03-01 MED ORDER — AMOXICILLIN-POT CLAVULANATE 875-125 MG PO TABS: 1.0000 | ORAL_TABLET | Freq: Two times a day (BID) | ORAL | 0 refills | Status: DC

## 2018-03-01 MED ORDER — HYDROCOD POLST-CPM POLST ER 10-8 MG/5ML PO SUER: 5.0000 mL | Freq: Two times a day (BID) | ORAL | 0 refills | Status: DC | PRN

## 2018-03-01 NOTE — Progress Notes (Signed)
Subjective:    Patient ID: Evan Lee, male    DOB: 01-04-1986, 32 y.o.   MRN: [id number]  HPI: Evan Lee presents with productive cough (thick/green mucus), nasal congestion (thick/green drainage), L ear pain (3/10), frontal HA (4/10), and L cervical lymphadenopathy that all began >1.5 weeks ago and has been steadily worsening. He had GI upset 2 weeks ago- N/V/D, he has lost 6 lbs since last OV in Feb 2019. He reports his wife was dx'd with bronchitis a few days ago.  Patient Care Team    Relationship Specialty Notifications Start End  Mellody Dance, DO PCP - General Family Medicine  03/26/16     Patient Active Problem List   Diagnosis Date Noted  . Bronchitis 03/01/2018  . Myalgia 10/13/2017  . Fever 10/13/2017  . h/o Anxiety state w diff sleeping 03/30/2016  . Elevated fasting glucose 03/26/2016  . Environmental and seasonal allergies 02/07/2016  . Routine general medical examination at a health care facility 01/03/2013  . Herpes genitalis in men 01/03/2013  . TMJ arthralgia 01/03/2013  . h/o RLS (restless legs syndrome) 01/03/2013     Past Medical History:  Diagnosis Date  . Elevated fasting glucose 03/26/2016  . Herpes genitalis in men 01/03/2013  . TMJ arthralgia 2014   + bruxism per his dentist     Past Surgical History:  Procedure Laterality Date  . ANTERIOR CRUCIATE LIGAMENT REPAIR  2002     Family History  Problem Relation Age of Onset  . Hypertension Father   . Arthritis Other   . Hypertension Other   . Diabetes Maternal Grandmother   . Cancer Neg Hx   . Early death Neg Hx   . Drug abuse Neg Hx   . Heart disease Neg Hx   . Hyperlipidemia Neg Hx   . Kidney disease Neg Hx   . Stroke Neg Hx      Social History   Substance and Sexual Activity  Drug Use No     Social History   Substance and Sexual Activity  Alcohol Use Yes  . Alcohol/week: 3.0 oz  . Types: 5 Cans of beer per week     Social History   Tobacco Use    Smoking Status Former Smoker  . Packs/day: 0.05  . Years: 1.00  . Pack years: 0.05  Smokeless Tobacco Never Used     Outpatient Encounter Medications as of 03/01/2018  Medication Sig  . amoxicillin-clavulanate (AUGMENTIN) 875-125 MG tablet Take 1 tablet by mouth 2 (two) times daily.  . chlorpheniramine-HYDROcodone (TUSSIONEX) 10-8 MG/5ML SUER Take 5 mLs by mouth every 12 (twelve) hours as needed.  . [DISCONTINUED] oseltamivir (TAMIFLU) 75 MG capsule Take 1 capsule (75 mg total) by mouth 2 (two) times daily.   No facility-administered encounter medications on file as of 03/01/2018.     Allergies: Patient has no known allergies.  Body mass index is 20.61 kg/m.  Blood pressure 107/66, pulse 80, temperature 98.8 F (37.1 C), temperature source Oral, height 5\' 11"  (1.803 m), weight 147 lb 12.8 oz (67 kg), SpO2 98 %.  Review of Systems  Constitutional: Positive for activity change, appetite change and fatigue. Negative for chills, diaphoresis, fever and unexpected weight change.  HENT: Positive for congestion, ear pain, postnasal drip, rhinorrhea and voice change. Negative for sore throat.   Eyes: Negative for visual disturbance.  Respiratory: Positive for cough. Negative for chest tightness, shortness of breath, wheezing and stridor.   Cardiovascular: Negative for chest pain, palpitations  and leg swelling.  Gastrointestinal: Negative for abdominal distention, abdominal pain, blood in stool, constipation, diarrhea, nausea and vomiting.  Endocrine: Negative for cold intolerance, heat intolerance, polydipsia, polyphagia and polyuria.  Genitourinary: Negative for difficulty urinating and flank pain.  Musculoskeletal: Negative for arthralgias, back pain, gait problem, joint swelling, myalgias, neck pain and neck stiffness.  Skin: Negative for color change, pallor, rash and wound.  Neurological: Positive for headaches. Negative for dizziness.  Psychiatric/Behavioral: Positive for sleep  disturbance. Negative for decreased concentration, dysphoric mood, hallucinations, self-injury and suicidal ideas. The patient is not nervous/anxious and is not hyperactive.        Objective:   Physical Exam  Constitutional: He is oriented to person, place, and time. He appears well-developed and well-nourished. No distress.  HENT:  Head: Normocephalic and atraumatic.  Right Ear: External ear normal. Tympanic membrane is bulging. Tympanic membrane is not erythematous. No decreased hearing is noted.  Left Ear: External ear normal. Tympanic membrane is erythematous and bulging. No decreased hearing is noted.  Nose: Mucosal edema and rhinorrhea present. Right sinus exhibits no maxillary sinus tenderness and no frontal sinus tenderness. Left sinus exhibits no maxillary sinus tenderness and no frontal sinus tenderness.  Mouth/Throat: Oropharynx is clear and moist.  Eyes: Pupils are equal, round, and reactive to light. Conjunctivae and EOM are normal.  Neck: Normal range of motion. Neck supple.    Cardiovascular: Normal rate, regular rhythm, normal heart sounds and intact distal pulses.  No murmur heard. Pulmonary/Chest: Effort normal and breath sounds normal. No stridor. No respiratory distress. He has no wheezes. He has no rales. He exhibits no tenderness.  Lymphadenopathy:    He has cervical adenopathy.  Neurological: He is alert and oriented to person, place, and time.  Skin: Skin is warm and dry. Capillary refill takes less than 2 seconds. No rash noted. He is not diaphoretic. No erythema. No pallor.  Psychiatric: He has a normal mood and affect. His behavior is normal. Judgment and thought content normal.  Nursing note and vitals reviewed.     Assessment & Plan:   1. Bronchitis     Bronchitis Please take Augmentin as directed and Tussionex as needed. [place] Controlled Substance Database reviewed- no aberrancies noted.  Increase fluids/rest/vit c- 2,000mg /day when not  feeling well. If symptoms persist after Augmentin completed, then please call clinic. Recommend complete physical with fasting labs this summer. Please call clinic with questions/concerns.    FOLLOW-UP:  Return if symptoms worsen or fail to improve.

## 2018-03-01 NOTE — Patient Instructions (Signed)
Acute Bronchitis, Adult Acute bronchitis is sudden (acute) swelling of the air tubes (bronchi) in the lungs. Acute bronchitis causes these tubes to fill with mucus, which can make it hard to breathe. It can also cause coughing or wheezing. In adults, acute bronchitis usually goes away within 2 weeks. A cough caused by bronchitis may last up to 3 weeks. Smoking, allergies, and asthma can make the condition worse. Repeated episodes of bronchitis may cause further lung problems, such as chronic obstructive pulmonary disease (COPD). What are the causes? This condition can be caused by germs and by substances that irritate the lungs, including:  Cold and flu viruses. This condition is most often caused by the same virus that causes a cold.  Bacteria.  Exposure to tobacco smoke, dust, fumes, and air pollution.  What increases the risk? This condition is more likely to develop in people who:  Have close contact with someone with acute bronchitis.  Are exposed to lung irritants, such as tobacco smoke, dust, fumes, and vapors.  Have a weak immune system.  Have a respiratory condition such as asthma.  What are the signs or symptoms? Symptoms of this condition include:  A cough.  Coughing up clear, yellow, or green mucus.  Wheezing.  Chest congestion.  Shortness of breath.  A fever.  Body aches.  Chills.  A sore throat.  How is this diagnosed? This condition is usually diagnosed with a physical exam. During the exam, your health care provider may order tests, such as chest X-rays, to rule out other conditions. He or she may also:  Test a sample of your mucus for bacterial infection.  Check the level of oxygen in your blood. This is done to check for pneumonia.  Do a chest X-ray or lung function testing to rule out pneumonia and other conditions.  Perform blood tests.  Your health care provider will also ask about your symptoms and medical history. How is this  treated? Most cases of acute bronchitis clear up over time without treatment. Your health care provider may recommend:  Drinking more fluids. Drinking more makes your mucus thinner, which may make it easier to breathe.  Taking a medicine for a fever or cough.  Taking an antibiotic medicine.  Using an inhaler to help improve shortness of breath and to control a cough.  Using a cool mist vaporizer or humidifier to make it easier to breathe.  Follow these instructions at home: Medicines  Take over-the-counter and prescription medicines only as told by your health care provider.  If you were prescribed an antibiotic, take it as told by your health care provider. Do not stop taking the antibiotic even if you start to feel better. General instructions  Get plenty of rest.  Drink enough fluids to keep your urine clear or pale yellow.  Avoid smoking and secondhand smoke. Exposure to cigarette smoke or irritating chemicals will make bronchitis worse. If you smoke and you need help quitting, ask your health care provider. Quitting smoking will help your lungs heal faster.  Use an inhaler, cool mist vaporizer, or humidifier as told by your health care provider.  Keep all follow-up visits as told by your health care provider. This is important. How is this prevented? To lower your risk of getting this condition again:  Wash your hands often with soap and water. If soap and water are not available, use hand sanitizer.  Avoid contact with people who have cold symptoms.  Try not to touch your hands to your   mouth, nose, or eyes.  Make sure to get the flu shot every year.  Contact a health care provider if:  Your symptoms do not improve in 2 weeks of treatment. Get help right away if:  You cough up blood.  You have chest pain.  You have severe shortness of breath.  You become dehydrated.  You faint or keep feeling like you are going to faint.  You keep vomiting.  You have a  severe headache.  Your fever or chills gets worse. This information is not intended to replace advice given to you by your health care provider. Make sure you discuss any questions you have with your health care provider. Document Released: 10/02/2004 Document Revised: 03/19/2016 Document Reviewed: 02/13/2016 Elsevier Interactive Patient Education  2018 [employer].   Otitis Media, Adult Otitis media occurs when there is inflammation and fluid in the middle ear. Your middle ear is a part of the ear that contains bones for hearing as well as air that helps send sounds to your brain. What are the causes? This condition is caused by a blockage in the eustachian tube. This tube drains fluid from the ear to the back of the nose (nasopharynx). A blockage in this tube can be caused by an object or by swelling (edema) in the tube. Problems that can cause a blockage include:  A cold or other upper respiratory infection.  Allergies.  An irritant, such as tobacco smoke.  Enlarged adenoids. The adenoids are areas of soft tissue located high in the back of the throat, behind the nose and the roof of the mouth.  A mass in the nasopharynx.  Damage to the ear caused by pressure changes (barotrauma).  What are the signs or symptoms? Symptoms of this condition include:  Ear pain.  A fever.  Decreased hearing.  A headache.  Tiredness (lethargy).  Fluid leaking from the ear.  Ringing in the ear.  How is this diagnosed? This condition is diagnosed with a physical exam. During the exam your health care provider will use an instrument called an otoscope to look into your ear and check for redness, swelling, and fluid. He or she will also ask about your symptoms. Your health care provider may also order tests, such as:  A test to check the movement of the eardrum (pneumatic otoscopy). This test is done by squeezing a small amount of air into the ear.  A test that changes air pressure in the  middle ear to check how well the eardrum moves and whether the eustachian tube is working (tympanogram).  How is this treated? This condition usually goes away on its own within 3-5 days. But if the condition is caused by a bacteria infection and does not go away own its own, or keeps coming back, your health care provider may:  Prescribe antibiotic medicines to treat the infection.  Prescribe or recommend medicines to control pain.  Follow these instructions at home:  Take over-the-counter and prescription medicines only as told by your health care provider.  If you were prescribed an antibiotic medicine, take it as told by your health care provider. Do not stop taking the antibiotic even if you start to feel better.  Keep all follow-up visits as told by your health care provider. This is important. Contact a health care provider if:  You have bleeding from your nose.  There is a lump on your neck.  You are not getting better in 5 days.  You feel worse instead of  better. Get help right away if:  You have severe pain that is not controlled with medicine.  You have swelling, redness, or pain around your ear.  You have stiffness in your neck.  A part of your face is paralyzed.  The bone behind your ear (mastoid) is tender when you touch it.  You develop a severe headache. Summary  Otitis media is redness, soreness, and swelling of the middle ear.  This condition usually goes away on its own within 3-5 days.  If the problem does not go away in 3-5 days, your health care provider may prescribe or recommend medicines to treat your symptoms.  If you were prescribed an antibiotic medicine, take it as told by your health care provider. This information is not intended to replace advice given to you by your health care provider. Make sure you discuss any questions you have with your health care provider. Document Released: 05/30/2004 Document Revised: 08/15/2016 Document  Reviewed: 08/15/2016 Elsevier Interactive Patient Education  2018 [employer].  Please take Augmentin as directed and Tussionex as needed. Increase fluids/rest/vit c- 2,000mg /day when not feeling well. If symptoms persist after Augmentin completed, then please call clinic. Recommend complete physical with fasting labs this summer. Please call clinic with questions/concerns. FEEL BETTER!

## 2018-03-01 NOTE — Assessment & Plan Note (Signed)
Please take Augmentin as directed and Tussionex as needed. [place] Controlled Substance Database reviewed- no aberrancies noted.  Increase fluids/rest/vit c- 2,000mg /day when not feeling well. If symptoms persist after Augmentin completed, then please call clinic. Recommend complete physical with fasting labs this summer. Please call clinic with questions/concerns.

## 2018-04-12 ENCOUNTER — Encounter: Payer: Self-pay | Admitting: Family Medicine

## 2018-07-02 DIAGNOSIS — G5621 Lesion of ulnar nerve, right upper limb: Secondary | ICD-10-CM | POA: Insufficient documentation

## 2018-07-02 DIAGNOSIS — R200 Anesthesia of skin: Secondary | ICD-10-CM | POA: Insufficient documentation

## 2018-08-27 ENCOUNTER — Ambulatory Visit: Payer: 59 | Admitting: Family Medicine

## 2018-09-03 ENCOUNTER — Encounter: Payer: Self-pay | Admitting: Adult Health

## 2018-09-03 ENCOUNTER — Telehealth: Payer: Self-pay | Admitting: Family Medicine

## 2018-09-03 ENCOUNTER — Ambulatory Visit: Payer: 59 | Admitting: Adult Health

## 2018-09-03 VITALS — BP 114/72 | HR 80 | Temp 98.8°F | Ht 71.0 in | Wt 144.8 lb

## 2018-09-03 DIAGNOSIS — J0100 Acute maxillary sinusitis, unspecified: Secondary | ICD-10-CM

## 2018-09-03 DIAGNOSIS — H6690 Otitis media, unspecified, unspecified ear: Secondary | ICD-10-CM | POA: Diagnosis not present

## 2018-09-03 DIAGNOSIS — J40 Bronchitis, not specified as acute or chronic: Secondary | ICD-10-CM | POA: Diagnosis not present

## 2018-09-03 MED ORDER — FLUTICASONE PROPIONATE 50 MCG/ACT NA SUSP: 2.0000 | Freq: Every day | NASAL | 2 refills | Status: DC

## 2018-09-03 MED ORDER — AMOXICILLIN-POT CLAVULANATE 875-125 MG PO TABS: 1.0000 | ORAL_TABLET | Freq: Two times a day (BID) | ORAL | 0 refills | Status: DC

## 2018-09-03 MED ORDER — HYDROCOD POLST-CPM POLST ER 10-8 MG/5ML PO SUER: 5.0000 mL | Freq: Two times a day (BID) | ORAL | 0 refills | Status: DC | PRN

## 2018-09-03 MED ORDER — BENZONATATE 200 MG PO CAPS: 200.0000 mg | ORAL_CAPSULE | Freq: Three times a day (TID) | ORAL | 0 refills | Status: DC | PRN

## 2018-09-03 NOTE — Patient Instructions (Addendum)
Otitis Media, Adult  Otitis media occurs when there is inflammation and fluid in the middle ear. Your middle ear is a part of the ear that contains bones for hearing as well as air that helps send sounds to your brain. What are the causes? This condition is caused by a blockage in the eustachian tube. This tube drains fluid from the ear to the back of the nose (nasopharynx). A blockage in this tube can be caused by an object or by swelling (edema) in the tube. Problems that can cause a blockage include:  A cold or other upper respiratory infection.  Allergies.  An irritant, such as tobacco smoke.  Enlarged adenoids. The adenoids are areas of soft tissue located high in the back of the throat, behind the nose and the roof of the mouth.  A mass in the nasopharynx.  Damage to the ear caused by pressure changes (barotrauma). What are the signs or symptoms? Symptoms of this condition include:  Ear pain.  A fever.  Decreased hearing.  A headache.  Tiredness (lethargy).  Fluid leaking from the ear.  Ringing in the ear. How is this diagnosed? This condition is diagnosed with a physical exam. During the exam your health care provider will use an instrument called an otoscope to look into your ear and check for redness, swelling, and fluid. He or she will also ask about your symptoms. Your health care provider may also order tests, such as:  A test to check the movement of the eardrum (pneumatic otoscopy). This test is done by squeezing a small amount of air into the ear.  A test that changes air pressure in the middle ear to check how well the eardrum moves and whether the eustachian tube is working (tympanogram). How is this treated? This condition usually goes away on its own within 3-5 days. But if the condition is caused by a bacteria infection and does not go away own its own, or keeps coming back, your health care provider may:  Prescribe antibiotic medicines to treat the  infection.  Prescribe or recommend medicines to control pain. Follow these instructions at home:  Take over-the-counter and prescription medicines only as told by your health care provider.  If you were prescribed an antibiotic medicine, take it as told by your health care provider. Do not stop taking the antibiotic even if you start to feel better.  Keep all follow-up visits as told by your health care provider. This is important. Contact a health care provider if:  You have bleeding from your nose.  There is a lump on your neck.  You are not getting better in 5 days.  You feel worse instead of better. Get help right away if:  You have severe pain that is not controlled with medicine.  You have swelling, redness, or pain around your ear.  You have stiffness in your neck.  A part of your face is paralyzed.  The bone behind your ear (mastoid) is tender when you touch it.  You develop a severe headache. Summary  Otitis media is redness, soreness, and swelling of the middle ear.  This condition usually goes away on its own within 3-5 days.  If the problem does not go away in 3-5 days, your health care provider may prescribe or recommend medicines to treat your symptoms.  If you were prescribed an antibiotic medicine, take it as told by your health care provider. This information is not intended to replace advice given to you by   your health care provider. Make sure you discuss any questions you have with your health care provider. Document Released: 05/30/2004 Document Revised: 08/15/2016 Document Reviewed: 08/15/2016 Elsevier Interactive Patient Education  2019 [employer]   Sinusitis, Adult Sinusitis is inflammation of your sinuses. Sinuses are hollow spaces in the bones around your face. Your sinuses are located:  Around your eyes.  In the middle of your forehead.  Behind your nose.  In your cheekbones. Mucus normally drains out of your sinuses. When your  nasal tissues become inflamed or swollen, mucus can become trapped or blocked. This allows bacteria, viruses, and fungi to grow, which leads to infection. Most infections of the sinuses are caused by a virus. Sinusitis can develop quickly. It can last for up to 4 weeks (acute) or for more than 12 weeks (chronic). Sinusitis often develops after a cold. What are the causes? This condition is caused by anything that creates swelling in the sinuses or stops mucus from draining. This includes:  Allergies.  Asthma.  Infection from bacteria or viruses.  Deformities or blockages in your nose or sinuses.  Abnormal growths in the nose (nasal polyps).  Pollutants, such as chemicals or irritants in the air.  Infection from fungi (rare). What increases the risk? You are more likely to develop this condition if you:  Have a weak body defense system (immune system).  Do a lot of swimming or diving.  Overuse nasal sprays.  Smoke. What are the signs or symptoms? The main symptoms of this condition are pain and a feeling of pressure around the affected sinuses. Other symptoms include:  Stuffy nose or congestion.  Thick drainage from your nose.  Swelling and warmth over the affected sinuses.  Headache.  Upper toothache.  A cough that may get worse at night.  Extra mucus that collects in the throat or the back of the nose (postnasal drip).  Decreased sense of smell and taste.  Fatigue.  A fever.  Sore throat.  Bad breath. How is this diagnosed? This condition is diagnosed based on:  Your symptoms.  Your medical history.  A physical exam.  Tests to find out if your condition is acute or chronic. This may include: ? Checking your nose for nasal polyps. ? Viewing your sinuses using a device that has a light (endoscope). ? Testing for allergies or bacteria. ? Imaging tests, such as an MRI or CT scan. In rare cases, a bone biopsy may be done to rule out more serious types  of fungal sinus disease. How is this treated? Treatment for sinusitis depends on the cause and whether your condition is chronic or acute.  If caused by a virus, your symptoms should go away on their own within 10 days. You may be given medicines to relieve symptoms. They include: ? Medicines that shrink swollen nasal passages (topical intranasal decongestants). ? Medicines that treat allergies (antihistamines). ? A spray that eases inflammation of the nostrils (topical intranasal corticosteroids). ? Rinses that help get rid of thick mucus in your nose (nasal saline washes).  If caused by bacteria, your health care provider may recommend waiting to see if your symptoms improve. Most bacterial infections will get better without antibiotic medicine. You may be given antibiotics if you have: ? A severe infection. ? A weak immune system.  If caused by narrow nasal passages or nasal polyps, you may need to have surgery. Follow these instructions at home: Medicines  Take, use, or apply over-the-counter and prescription medicines only as told  by your health care provider. These may include nasal sprays.  If you were prescribed an antibiotic medicine, take it as told by your health care provider. Do not stop taking the antibiotic even if you start to feel better. Hydrate and humidify   Drink enough fluid to keep your urine pale yellow. Staying hydrated will help to thin your mucus.  Use a cool mist humidifier to keep the humidity level in your home above 50%.  Inhale steam for 10-15 minutes, 3-4 times a day, or as told by your health care provider. You can do this in the bathroom while a hot shower is running.  Limit your exposure to cool or dry air. Rest  Rest as much as possible.  Sleep with your head raised (elevated).  Make sure you get enough sleep each night. General instructions   Apply a warm, moist washcloth to your face 3-4 times a day or as told by your health care  provider. This will help with discomfort.  Wash your hands often with soap and water to reduce your exposure to germs. If soap and water are not available, use hand sanitizer.  Do not smoke. Avoid being around people who are smoking (secondhand smoke).  Keep all follow-up visits as told by your health care provider. This is important. Contact a health care provider if:  You have a fever.  Your symptoms get worse.  Your symptoms do not improve within 10 days. Get help right away if:  You have a severe headache.  You have persistent vomiting.  You have severe pain or swelling around your face or eyes.  You have vision problems.  You develop confusion.  Your neck is stiff.  You have trouble breathing. Summary  Sinusitis is soreness and inflammation of your sinuses. Sinuses are hollow spaces in the bones around your face.  This condition is caused by nasal tissues that become inflamed or swollen. The swelling traps or blocks the flow of mucus. This allows bacteria, viruses, and fungi to grow, which leads to infection.  If you were prescribed an antibiotic medicine, take it as told by your health care provider. Do not stop taking the antibiotic even if you start to feel better.  Keep all follow-up visits as told by your health care provider. This is important. This information is not intended to replace advice given to you by your health care provider. Make sure you discuss any questions you have with your health care provider. Document Released: 08/25/2005 Document Revised: 01/25/2018 Document Reviewed: 01/25/2018 Elsevier Interactive Patient Education  2019 [employer].  Please take Augmentin as directed and Flonase and Tussionex as needed. Increase fluids/rest/vit-c 2,000mg /day when not feeling well. Alternate OTC Acetaminophen and Ibuprofen as needed for discomfort/fever. If symptoms persist after antibiotic completed, then call clinic. FEEL BETTER!

## 2018-09-03 NOTE — Assessment & Plan Note (Signed)
[place] Controlled Substance Database reviewed- no aberrancies noted Tussionex as needed Increase fluids Continue to abstain from tobacco/vape use

## 2018-09-03 NOTE — Telephone Encounter (Signed)
Patient called states went to pick up Rx : ( was told pre-authorization required)  chlorpheniramine-HYDROcodone (TUSSIONEX) 10-8 MG/5ML Latanya Presser [[id number]]   Order Details  Dose: 5 mL Route: Oral Frequency: Every 12 hours PRN  Dispense Quantity: 200 mL Refills: 0 Fills remaining: --        Sig: Take 5 mLs by mouth every 12 (twelve) hours as needed.          Forwarding message to medical assistant to contact [phone] for authorization.  --glh

## 2018-09-03 NOTE — Telephone Encounter (Signed)
Per Valetta Fuller, will switch to Tessalon.  RX sent to pharmacy and pt informed.  Pharmacy informed to cancel RX for Tussionex. Charyl Bigger, CMA

## 2018-09-03 NOTE — Progress Notes (Signed)
Subjective:    Patient ID: Evan Lee, male    DOB: February 19, 1986, 32 y.o.   MRN: [id number]  HPI:  [name] presents with bilateral ear pain (8/10), frontal HA (10/10), thick/yellow nasal drainage, non-productive cough, sore throat (3/10), and fatigue that started 2 weeks ago and has been steadily worsening. He denies CP/dyspnea/dizziness/palpitations/N/V/D/fever/night sweats/chills He has been pushing fluids and using OTC Acetaminophen and Ibuprofen, last dose of NSAID was this morning at 0600, current temp 98.8 f oral He denies tobacco/vape use  Patient Care Team    Relationship Specialty Notifications Start End  Mellody Dance, DO PCP - General Family Medicine  03/26/16     Patient Active Problem List   Diagnosis Date Noted  . Acute otitis media 09/03/2018  . Acute maxillary sinusitis 09/03/2018  . Bronchitis 03/01/2018  . Myalgia 10/13/2017  . Fever 10/13/2017  . h/o Anxiety state w diff sleeping 03/30/2016  . Elevated fasting glucose 03/26/2016  . Environmental and seasonal allergies 02/07/2016  . Routine general medical examination at a health care facility 01/03/2013  . Herpes genitalis in men 01/03/2013  . TMJ arthralgia 01/03/2013  . h/o RLS (restless legs syndrome) 01/03/2013     Past Medical History:  Diagnosis Date  . Elevated fasting glucose 03/26/2016  . Herpes genitalis in men 01/03/2013  . TMJ arthralgia 2014   + bruxism per his dentist     Past Surgical History:  Procedure Laterality Date  . ANTERIOR CRUCIATE LIGAMENT REPAIR  2002     Family History  Problem Relation Age of Onset  . Hypertension Father   . Arthritis Other   . Hypertension Other   . Diabetes Maternal Grandmother   . Cancer Neg Hx   . Early death Neg Hx   . Drug abuse Neg Hx   . Heart disease Neg Hx   . Hyperlipidemia Neg Hx   . Kidney disease Neg Hx   . Stroke Neg Hx      Social History   Substance and Sexual Activity  Drug Use No     Social  History   Substance and Sexual Activity  Alcohol Use Yes  . Alcohol/week: 5.0 standard drinks  . Types: 5 Cans of beer per week     Social History   Tobacco Use  Smoking Status Former Smoker  . Packs/day: 0.05  . Years: 1.00  . Pack years: 0.05  Smokeless Tobacco Never Used     Outpatient Encounter Medications as of 09/03/2018  Medication Sig  . amoxicillin-clavulanate (AUGMENTIN) 875-125 MG tablet Take 1 tablet by mouth 2 (two) times daily.  . chlorpheniramine-HYDROcodone (TUSSIONEX) 10-8 MG/5ML SUER Take 5 mLs by mouth every 12 (twelve) hours as needed.  . fluticasone (FLONASE) 50 MCG/ACT nasal spray Place 2 sprays into both nostrils daily.  . [DISCONTINUED] amoxicillin-clavulanate (AUGMENTIN) 875-125 MG tablet Take 1 tablet by mouth 2 (two) times daily.  . [DISCONTINUED] chlorpheniramine-HYDROcodone (TUSSIONEX) 10-8 MG/5ML SUER Take 5 mLs by mouth every 12 (twelve) hours as needed.  . [DISCONTINUED] chlorpheniramine-HYDROcodone (TUSSIONEX) 10-8 MG/5ML SUER Take 5 mLs by mouth every 12 (twelve) hours as needed.   No facility-administered encounter medications on file as of 09/03/2018.     Allergies: Patient has no known allergies.  Body mass index is 20.2 kg/m.  Blood pressure 114/72, pulse 80, temperature 98.8 F (37.1 C), temperature source Oral, height 5\' 11"  (1.803 m), weight 144 lb 12.8 oz (65.7 kg), SpO2 100 %.    Review of Systems  Constitutional: Positive for activity change and fatigue. Negative for appetite change, chills, diaphoresis, fever and unexpected weight change.  HENT: Positive for congestion, ear pain, facial swelling, postnasal drip, rhinorrhea, sinus pressure, sinus pain, sneezing, sore throat and voice change. Negative for ear discharge and trouble swallowing.   Eyes: Negative for visual disturbance.  Respiratory: Positive for cough. Negative for chest tightness, shortness of breath, wheezing and stridor.   Cardiovascular: Negative for chest  pain, palpitations and leg swelling.  Gastrointestinal: Negative for abdominal distention, abdominal pain, blood in stool, constipation, diarrhea, nausea and vomiting.  Endocrine: Negative for cold intolerance, heat intolerance, polydipsia, polyphagia and polyuria.  Genitourinary: Negative for difficulty urinating and flank pain.  Musculoskeletal: Negative for arthralgias and myalgias.  Neurological: Positive for headaches. Negative for dizziness.  Hematological: Does not bruise/bleed easily.  Psychiatric/Behavioral: Positive for sleep disturbance.       Objective:   Physical Exam Constitutional:      General: He is not in acute distress.    Appearance: Normal appearance. He is normal weight. He is ill-appearing.  HENT:     Head: Normocephalic and atraumatic.     Right Ear: No decreased hearing noted. Tympanic membrane is erythematous and bulging.     Left Ear: No decreased hearing noted. Tympanic membrane is erythematous and bulging.     Nose:     Right Sinus: Maxillary sinus tenderness and frontal sinus tenderness present.     Left Sinus: Maxillary sinus tenderness and frontal sinus tenderness present.     Mouth/Throat:     Pharynx: Posterior oropharyngeal erythema present. No oropharyngeal exudate or uvula swelling.     Tonsils: No tonsillar exudate. Swelling: 1+ on the right. 1+ on the left.  Cardiovascular:     Rate and Rhythm: Normal rate.     Pulses: Normal pulses.     Heart sounds: Normal heart sounds. No murmur. No friction rub. No gallop.   Pulmonary:     Effort: Pulmonary effort is normal. No respiratory distress.     Breath sounds: Normal breath sounds. No stridor. No wheezing, rhonchi or rales.  Chest:     Chest wall: No tenderness.  Skin:    General: Skin is warm and dry.     Capillary Refill: Capillary refill takes less than 2 seconds.  Neurological:     Mental Status: He is alert and oriented to person, place, and time.  Psychiatric:        Mood and Affect:  Mood normal.        Behavior: Behavior normal.        Thought Content: Thought content normal.        Judgment: Judgment normal.       Assessment & Plan:   1. Bronchitis   2. Acute otitis media, unspecified otitis media type   3. Acute maxillary sinusitis, recurrence not specified     Bronchitis [place] Controlled Substance Database reviewed- no aberrancies noted Tussionex as needed Increase fluids Continue to abstain from tobacco/vape use  Acute otitis media Please take Augmentin as directed and Flonase and Tussionex as needed. Increase fluids/rest/vit-c 2,000mg /day when not feeling well. Alternate OTC Acetaminophen and Ibuprofen as needed for discomfort/fever. If symptoms persist after antibiotic completed, then call clinic.  Acute maxillary sinusitis Please take Augmentin as directed and Flonase and Tussionex as needed. Increase fluids/rest/vit-c 2,000mg /day when not feeling well. Alternate OTC Acetaminophen and Ibuprofen as needed for discomfort/fever. If symptoms persist after antibiotic completed, then call clinic.    FOLLOW-UP:  Return if symptoms worsen or fail to improve. 

## 2018-09-03 NOTE — Assessment & Plan Note (Signed)
Please take Augmentin as directed and Flonase and Tussionex as needed. Increase fluids/rest/vit-c 2,000mg /day when not feeling well. Alternate OTC Acetaminophen and Ibuprofen as needed for discomfort/fever. If symptoms persist after antibiotic completed, then call clinic.

## 2018-09-06 ENCOUNTER — Encounter: Payer: Self-pay | Admitting: Family Medicine

## 2018-09-07 ENCOUNTER — Telehealth: Payer: Self-pay | Admitting: Family Medicine

## 2018-09-07 NOTE — Telephone Encounter (Signed)
Good Afternoon Tonya, Recommend completing the full course of ABX Also he can take OTC Antihistamine (ie. Claritin, Zyrtec) to help relieve pressure Thanks! Valetta Fuller

## 2018-09-07 NOTE — Telephone Encounter (Signed)
LVM informing pt.  Charyl Bigger, CMA

## 2018-09-07 NOTE — Telephone Encounter (Signed)
Patient called during lunch and left VM that he is still suffering from symptoms related to acute OV last week and wants to discuss with clinic staff how to best handle them.

## 2018-09-07 NOTE — Telephone Encounter (Signed)
Pt states that his ear still feels "clogged up" with otalgia.  He has been taking the amoxicillin and flonase as directed.  Pt denies any fevers.  Please advise.  Charyl Bigger, CMA

## 2018-09-10 ENCOUNTER — Telehealth: Payer: Self-pay | Admitting: Family Medicine

## 2018-09-10 ENCOUNTER — Other Ambulatory Visit: Payer: Self-pay | Admitting: Adult Health

## 2018-09-10 MED ORDER — AZITHROMYCIN 250 MG PO TABS: ORAL_TABLET | ORAL | 0 refills | Status: DC

## 2018-09-10 NOTE — Telephone Encounter (Signed)
Patient called states was seen 12/27 for Ear infection (both) per pt , the amoxicillin is not working he still has pain & blockage in both ears .  ---Pt wishes provider to change Rx to Azithromycin which he states has worked for him in the past.  ---Acquanetta Belling message to medical assistant for review with provider & if approved send to :      [id number] [place], [place] [phone] (Phone) [phone] (Fax)   --glh

## 2018-09-10 NOTE — Telephone Encounter (Signed)
Please advise. MPulliam, CMA/RT(R)  

## 2018-09-10 NOTE — Telephone Encounter (Signed)
Good Morning Evan Lee, Can you pleas call pt and tell him- Z pack sent in Thanks! Valetta Fuller

## 2018-09-10 NOTE — Telephone Encounter (Signed)
Patient notified. MPulliam, CMA/RT(R)  

## 2018-09-13 ENCOUNTER — Telehealth: Payer: Self-pay | Admitting: Family Medicine

## 2018-09-13 NOTE — Telephone Encounter (Signed)
I have not seen the patient since 8 of 2017.  He has only seen Katie 3-4 times in the past year and a half.  Please ask him if he switched medical providers or what.  Also, since I have not seen him in over a year 2-1/2 years, he will need an office visit to discuss these issues with me.  This should be a extended OV since I have not seen him in so long

## 2018-09-13 NOTE — Telephone Encounter (Signed)
Patient called states he is still having issues with ear infections/blockage & doesn't know what to do ( he has tried 2 diff antibiotic still no relief) & has been seen by both providers.   --Forwarding message to medical assistant to review with provider & call pt back at [phone].  --glh

## 2018-09-13 NOTE — Telephone Encounter (Signed)
Called patient left message to call the office.  Patient has appt on 09/15/2018 with Dr. Raliegh Scarlet. MPulliam, CMA/RT(R)

## 2018-09-13 NOTE — Telephone Encounter (Signed)
Would you like for [place] to place a referral to ENT?  Please advise.  Charyl Bigger, CMA

## 2018-09-15 ENCOUNTER — Encounter: Payer: Self-pay | Admitting: Family Medicine

## 2018-09-15 ENCOUNTER — Ambulatory Visit: Payer: 59 | Admitting: Family Medicine

## 2018-09-15 VITALS — BP 115/67 | HR 73 | Temp 97.9°F | Ht 71.0 in | Wt 146.2 lb

## 2018-09-15 DIAGNOSIS — J019 Acute sinusitis, unspecified: Secondary | ICD-10-CM | POA: Diagnosis not present

## 2018-09-15 DIAGNOSIS — H6690 Otitis media, unspecified, unspecified ear: Secondary | ICD-10-CM | POA: Diagnosis not present

## 2018-09-15 DIAGNOSIS — H6091 Unspecified otitis externa, right ear: Secondary | ICD-10-CM

## 2018-09-15 DIAGNOSIS — H6981 Other specified disorders of Eustachian tube, right ear: Secondary | ICD-10-CM

## 2018-09-15 MED ORDER — PREDNISONE 20 MG PO TABS: ORAL_TABLET | ORAL | 0 refills | Status: DC

## 2018-09-15 MED ORDER — CEFUROXIME AXETIL 500 MG PO TABS: 500.0000 mg | ORAL_TABLET | Freq: Two times a day (BID) | ORAL | 0 refills | Status: DC

## 2018-09-15 MED ORDER — CIPROFLOXACIN-DEXAMETHASONE 0.3-0.1 % OT SUSP: 4.0000 [drp] | Freq: Two times a day (BID) | OTIC | 0 refills | Status: AC

## 2018-09-15 NOTE — Patient Instructions (Signed)

## 2018-09-15 NOTE — Progress Notes (Signed)
Acute Care Office visit Assessment and plan:  1. Subacute otitis media, unspecified otitis media type   2. ETD (Eustachian tube dysfunction), right   3. Otitis externa of right ear, unspecified chronicity, unspecified type   4. Subacute sinusitis, unspecified location      - Viral vs Allergic vs Bacterial causes for pt's symptoms reveiwed.   - Patient has tried antibiotics in the past, but has not finished courses of treatment.  - Supportive care and various OTC medications discussed in addition to any prescribed.  - Steroids prescribed today.  - Referral to ENT recommended today.  - Call or RTC if new symptoms, or if no improvement or worse over next several days.       Meds ordered this encounter  Medications  . cefUROXime (CEFTIN) 500 MG tablet    Sig: Take 1 tablet (500 mg total) by mouth 2 (two) times daily with a meal.    Dispense:  20 tablet    Refill:  0  . predniSONE (DELTASONE) 20 MG tablet    Sig: Take 3 tabs po * 2 days, then 2 tabs for 2 d, then 1 tab 2 d, then 1/2 tab 2 days.    Dispense:  15 tablet    Refill:  0  . ciprofloxacin-dexamethasone (CIPRODEX) OTIC suspension    Sig: Place 4 drops into the right ear 2 (two) times daily for 7 days.    Dispense:  7.5 mL    Refill:  0    Medications Discontinued During This Encounter  Medication Reason  . azithromycin (ZITHROMAX) 250 MG tablet Completed Course  . benzonatate (TESSALON) 200 MG capsule Prescription never filled     Orders Placed This Encounter  Procedures  . Ambulatory referral to ENT    Gross side effects, risk and benefits, and alternatives of medications discussed with patient.  Patient is aware that all medications have potential side effects and we are unable to predict every sideeffect or drug-drug interaction that may occur.  Expresses verbal understanding and consents to current therapy plan and treatment regiment.   Education and routine counseling performed. Handouts  provided.  Anticipatory guidance and routine counseling done re: condition, txmnt options and need for follow up. All questions of patient's were answered.  Return for f/up near future for [facility] PE with FBW.  Please see AVS handed out to patient at the end of our visit for additional patient instructions/ counseling done pertaining to today's office visit.  Note:  This document was partially repared using Dragon voice recognition software and may include unintentional dictation errors.  This document serves as a record of services personally performed by Mellody Dance, DO. It was created on her behalf by Toni Amend, a trained medical scribe. The creation of this record is based on the scribe's personal observations and the provider's statements to them.   I have reviewed the above medical documentation for accuracy and completeness and I concur.  Mellody Dance, DO 09/15/2018 9:23 PM       Subjective:    Chief Complaint  Patient presents with  . Otalgia    HPI:  Pt presents for follow up after last visit.  Patient's symptoms started about 13 days ago, on December 19th, 2019.  Sx at that time included upper respiratory congestion as well as ear pain and post nasal drip with dry cough.  This went on for several days.  On the 27th of December 2019, patient came in to see NP  Katy Danford.  At that time, he was diagnosed with acute otitis media, sinusitis, and bronchitis.  He was given antibiotics of Augmentin, as well as Tussionex for cough, and told to use Flonase.    After five days of no improvement of his symptoms, he called back, and Katy changed the antibiotic to a Z-pack.   C/o:  Symptoms did improve initially, but the last 2-3 days, he has experienced right ear fullness, and today woke up with some ear pain.  Ongoing symptoms consistent with otitis media, sinusitis.  Denies: Runny nose, cough, sinusitis, congestion, no other complaints.    For symptoms patient has  tried:  Augmentin, a course of antibiotics which he did not finish (took 5 days of Augmentin), and Z-pack (which he did finish).  Overall getting:  Right ear fullness and ear pain today.  Had strep and ear infections often as a kid.  Patient has been sleeping poorly.  Notes that his daughter, who will be two in February, hasn't been sleeping well for the past two years.  He has been in the room with his daughter, sleeping in the rocking chair while she is in the crib, to help her sleep.  While this sleep situation has been improving over the past few weeks, he hasn't slept well lately.    Patient Care Team    Relationship Specialty Notifications Start End  Mellody Dance, DO PCP - General Family Medicine  03/26/16     Past medical history, Surgical history, Family history reviewed and noted below, Social history, Allergies, and Medications have been entered into the medical record, reviewed and changed as needed.   No Known Allergies  Review of Systems: - see above HPI for pertinent positives General:   No F/C, wt loss Pulm:   No DIB, pleuritic chest pain Card:  No CP, palpitations Abd:  No n/v/d or pain Ext:  No inc edema from baseline   Objective:   Blood pressure 115/67, pulse 73, temperature 97.9 F (36.6 C), temperature source Oral, height 5\' 11"  (1.803 m), weight 146 lb 3.2 oz (66.3 kg), SpO2 100 %. Body mass index is 20.39 kg/m. General: Well Developed, well nourished, appropriate for stated age.  Neuro: Alert and oriented x3, extra-ocular muscles intact, sensation grossly intact.  HEENT: Normocephalic, atraumatic, pupils equal round reactive to light, neck supple, no masses, no painful lymphadenopathy, TM's intact B/L, Left TM normal, but with scarring due to old PE tubes.  Right EAC swollen and erythematous, and Right TM is erythematous and has bullae. Nares- patent, clear d/c, OP- clear, mild erythema, No TTP sinuses Skin: Warm and dry, no gross rash. Cardiac: RRR, S1  S2,  no murmurs rubs or gallops.  Respiratory: ECTA B/L and A/P, Not using accessory muscles, speaking in full sentences- unlabored. Vascular:  No gross lower ext edema, cap RF less 2 sec. Psych: No HI/SI, judgement and insight good, Euthymic mood. Full Affect.

## 2018-10-25 ENCOUNTER — Ambulatory Visit: Payer: 59 | Admitting: Family Medicine

## 2018-10-26 ENCOUNTER — Encounter: Payer: Self-pay | Admitting: Family Medicine

## 2018-10-26 ENCOUNTER — Ambulatory Visit (INDEPENDENT_AMBULATORY_CARE_PROVIDER_SITE_OTHER): Payer: 59 | Admitting: Family Medicine

## 2018-10-26 VITALS — BP 126/89 | HR 81 | Temp 98.1°F | Ht 71.0 in | Wt 148.3 lb

## 2018-10-26 DIAGNOSIS — K4090 Unilateral inguinal hernia, without obstruction or gangrene, not specified as recurrent: Secondary | ICD-10-CM

## 2018-10-26 DIAGNOSIS — R1909 Other intra-abdominal and pelvic swelling, mass and lump: Secondary | ICD-10-CM | POA: Diagnosis not present

## 2018-10-26 DIAGNOSIS — R1032 Left lower quadrant pain: Secondary | ICD-10-CM

## 2018-10-26 NOTE — Progress Notes (Signed)
Pt here for an acute care OV today  Impression and Recommendations:    1. Left groin mass   2. Groin pain, left   3. Inguinal hernia of left side without obstruction or gangrene     1. Left groin mass, left groin pain - inguinal hernia of left side without obstruction or gangrene - Extensive education about causes of patient symptoms provided today.  All questions were answered. - Lengthy discussion held with patient regarding need for surgical hernia repair in very near future.  - Referral to General Surgery consult provided today. - Ultrasound ordered today.  - Extensively discussed red flag symptoms of surgical emergency.  Reviewed that if the hernia emerges and is not reducible, patient should go to the emergency room for immediate care.  - Advised patient to avoid lifting, avoid bearing down, and avoid straining, even to pass stool. - Recommended that patient take Miralax to help soften and keep stools loose to avoid strain.  - Education provided today regarding hernia repair.  - Follow-up PRN.   Medications Discontinued During This Encounter  Medication Reason  . cefUROXime (CEFTIN) 500 MG tablet Completed Course  . chlorpheniramine-HYDROcodone ([place]) 10-8 MG/5ML SUER Completed Course  . fluticasone (FLONASE) 50 MCG/ACT nasal spray Completed Course  . predniSONE (DELTASONE) 20 MG tablet Completed Course     Orders Placed This Encounter  Procedures  . US Pelvis Limited  . Ambulatory referral to General Surgery     Education and routine counseling performed. Handouts provided  Gross side effects, risk and benefits, and alternatives of medications and treatment plan in general discussed with patient.  Patient is aware that all medications have potential side effects and we are unable to predict every side effect or drug-drug interaction that may occur.   Patient will call with any questions prior to using medication if they have concerns.    Expresses verbal  understanding and consents to current therapy and treatment regimen.  No barriers to understanding were identified.  Red flag symptoms and signs discussed in detail.  Patient expressed understanding regarding what to do in case of emergency\urgent symptoms   Please see AVS handed out to patient at the end of our visit for further patient instructions/ counseling done pertaining to today's office visit.   Return if symptoms worsen or fail to improve, for F-up of current med issues as previously d/c pt.     Note:  This document was prepared occasionally using Dragon voice recognition software and may include unintentional dictation errors in addition to a scribe.  This document serves as a record of services personally performed by Mellody Dance, DO. It was created on her behalf by Toni Amend, a trained medical scribe. The creation of this record is based on the scribe's personal observations and the provider's statements to them.   I have reviewed the above medical documentation for accuracy and completeness and I concur.  Mellody Dance, DO 10/26/2018 8:51 PM       ---------------------------------------------------------------------------------------------------------------------------    Subjective:    CC:  Chief Complaint  Patient presents with  . Mass    HPI: Evan Lee is a 33 y.o. male who presents to [place] at [facility] today for suspected inguinal hernia.  Notes he lifts fifty lb bags of sugar and thirty lb bags of cream cheese all day.  Has had symptoms for around 2-3 weeks, states "3 weeks max."  Notes that 2-3 weeks ago, he started experiencing pain  in his groin while using the bathroom (pooping).  Patient cannot remember any injury or issuing event that caused this.  Patient notes that he recently changed his wife's rotors and brake pads, about two weeks ago.  His symptoms worsened after this.  At onset of symptoms,  when using the bathroom (pooping), he notes he felt soreness during straining or pushing.  He also felt burning "kind of."  Does not experience symptoms while urinating.  Denies pain on urination, denies hematuria.  Confirms that he feels soreness/needling/burning/fullness in the groin whenever he pushes to pass stool.  In the last week, notes he's now been feeling some soreness when he lifts objects and bears down.  Recently, when he got out of the shower, he "saw a bulge."  Notes that the bulge changes, getting worse and better and worse over the past few days.  Notes he "brushed the pain side of it off until he noticed the bulge."  He's had a couple of bad days recently of loose stools.  Denies constipation.  Says "that could just be my diet, I have a pretty sensitive stomach."  Confirms that loose stool happens to him sometimes irregardless.  Has not had pain in his groin or testes like this in the past.  If he strains and flexes his muscles, the bulge slowly moves.  The area is sore when he touches it, and this pain is worsening.  Notes he has been having nerve pain "up his ribs" and left side of his groin in the past couple of days.  Has started hurting in his testes in the last two days.  His left side hurts worse than the right.   Problem  Inguinal Hernia of Left Side Without Obstruction Or Gangrene     Wt Readings from Last 3 Encounters:  10/26/18 148 lb 4.8 oz (67.3 kg)  09/15/18 146 lb 3.2 oz (66.3 kg)  09/03/18 144 lb 12.8 oz (65.7 kg)   BP Readings from Last 3 Encounters:  10/26/18 126/89  09/15/18 115/67  09/03/18 114/72   BMI Readings from Last 3 Encounters:  10/26/18 20.68 kg/m  09/15/18 20.39 kg/m  09/03/18 20.20 kg/m     Patient Care Team    Relationship Specialty Notifications Start End  Mellody Dance, DO PCP - General Family Medicine  03/26/16      Patient Active Problem List   Diagnosis Date Noted  . Inguinal hernia of left side without obstruction  or gangrene 10/26/2018  . Acute otitis media 09/03/2018  . Acute maxillary sinusitis 09/03/2018  . Entrapment of right ulnar nerve 07/02/2018  . Numbness 07/02/2018  . Bronchitis 03/01/2018  . Myalgia 10/13/2017  . Fever 10/13/2017  . h/o Anxiety state w diff sleeping 03/30/2016  . Elevated fasting glucose 03/26/2016  . Environmental and seasonal allergies 02/07/2016  . Routine general medical examination at a health care facility 01/03/2013  . Herpes genitalis in men 01/03/2013  . TMJ arthralgia 01/03/2013  . h/o RLS (restless legs syndrome) 01/03/2013      Past Medical History:  Diagnosis Date  . Elevated fasting glucose 03/26/2016  . Herpes genitalis in men 01/03/2013  . TMJ arthralgia 2014   + bruxism per his dentist     Past Surgical History:  Procedure Laterality Date  . ANTERIOR CRUCIATE LIGAMENT REPAIR  2002     Family History  Problem Relation Age of Onset  . Hypertension Father   . Arthritis Other   . Hypertension Other   . Diabetes Maternal  Grandmother   . Cancer Neg Hx   . Early death Neg Hx   . Drug abuse Neg Hx   . Heart disease Neg Hx   . Hyperlipidemia Neg Hx   . Kidney disease Neg Hx   . Stroke Neg Hx      Social History   Socioeconomic History  . Marital status: Married    Spouse name: Not on file  . Number of children: Not on file  . Years of education: Not on file  . Highest education level: Not on file  Occupational History  . Not on file  Social Needs  . Financial resource strain: Not on file  . Food insecurity:    Worry: Not on file    Inability: Not on file  . Transportation needs:    Medical: Not on file    Non-medical: Not on file  Tobacco Use  . Smoking status: Former Smoker    Packs/day: 0.05    Years: 1.00    Pack years: 0.05  . Smokeless tobacco: Never Used  Substance and Sexual Activity  . Alcohol use: Yes    Alcohol/week: 5.0 standard drinks    Types: 5 Cans of beer per week  . Drug use: No  . Sexual activity:  Yes  Lifestyle  . Physical activity:    Days per week: Not on file    Minutes per session: Not on file  . Stress: Not on file  Relationships  . Social connections:    Talks on phone: Not on file    Gets together: Not on file    Attends religious service: Not on file    Active member of club or organization: Not on file    Attends meetings of clubs or organizations: Not on file    Relationship status: Not on file  . Intimate partner violence:    Fear of current or ex partner: Not on file    Emotionally abused: Not on file    Physically abused: Not on file    Forced sexual activity: Not on file  Other Topics Concern  . Not on file  Social History Narrative   ** Merged History Encounter **         No outpatient medications have been marked as taking for the 10/26/18 encounter (Office Visit) with Mellody Dance, DO.    Allergies:  No Known Allergies   Review of Systems: General:   Denies fever, chills, unexplained weight loss.  Optho/Auditory:   Denies visual changes, blurred vision/LOV Respiratory:   Denies wheeze, DOE more than baseline levels.   Cardiovascular:   Denies chest pain, palpitations, new onset peripheral edema  Gastrointestinal:   Denies nausea, vomiting, diarrhea, abd pain.  Genitourinary: Denies dysuria, freq/ urgency, flank pain or discharge from genitals.  Endocrine:     Denies hot or cold intolerance, polyuria, polydipsia. Musculoskeletal:   Denies unexplained myalgias, joint swelling, unexplained arthralgias, gait problems.  Skin:  Denies new onset rash, suspicious lesions Neurological:     Denies dizziness, unexplained weakness, numbness  Psychiatric/Behavioral:   Denies mood changes, suicidal or homicidal ideations, hallucinations    Objective:   Blood pressure 126/89, pulse 81, temperature 98.1 F (36.7 C), height 5\' 11"  (1.803 m), weight 148 lb 4.8 oz (67.3 kg), SpO2 100 %. Body mass index is 20.68 kg/m. General:  Well Developed, well  nourished, appropriate for stated age.  Neuro:  Alert and oriented,  extra-ocular muscles intact  HEENT:  Normocephalic, atraumatic, neck supple Skin:  no gross rash, warm, pink. Cardiac:  RRR, S1 S2 Respiratory:  ECTA B/L and A/P, Not using accessory muscles, speaking in full sentences- unlabored. Vascular:  Ext warm, no cyanosis apprec.; cap RF less 2 sec. Psych:  No HI/SI, judgement and insight good, Euthymic mood. Full Affect. Abdominal:  Soft, non-tender, bowel sounds active all four quadrants, no guarding, rigidity, rebound, no masses, no organomegaly. Genitalia:  Ext genitalia: without lesion, no penile rash or discharge.  His left testicle is inflamed and feels as if there is a squishy bag of worms and more prominent vs the right.  On exam, inguinal hernia present, likely direct.

## 2018-10-26 NOTE — Patient Instructions (Signed)

## 2018-10-28 ENCOUNTER — Ambulatory Visit
Admission: RE | Admit: 2018-10-28 | Discharge: 2018-10-28 | Disposition: A | Discharge status: Home / Self Care | Payer: 59 | Source: Ambulatory Visit | Attending: Family Medicine | Admitting: Family Medicine

## 2018-10-28 ENCOUNTER — Other Ambulatory Visit: Payer: Self-pay

## 2018-10-28 DIAGNOSIS — K4090 Unilateral inguinal hernia, without obstruction or gangrene, not specified as recurrent: Secondary | ICD-10-CM

## 2018-10-28 DIAGNOSIS — R1032 Left lower quadrant pain: Secondary | ICD-10-CM

## 2018-10-28 DIAGNOSIS — R1909 Other intra-abdominal and pelvic swelling, mass and lump: Secondary | ICD-10-CM

## 2018-10-29 ENCOUNTER — Other Ambulatory Visit: Payer: Self-pay

## 2018-10-29 ENCOUNTER — Encounter: Payer: Self-pay | Admitting: Family Medicine

## 2018-10-29 NOTE — Progress Notes (Unsigned)
referr

## 2018-11-01 ENCOUNTER — Encounter: Payer: Self-pay | Admitting: Family Medicine

## 2018-11-01 ENCOUNTER — Other Ambulatory Visit: Payer: Self-pay | Admitting: Adult Health

## 2018-11-01 MED ORDER — TRAMADOL HCL 50 MG PO TABS: 50.0000 mg | ORAL_TABLET | Freq: Two times a day (BID) | ORAL | 0 refills | Status: AC | PRN

## 2018-11-05 ENCOUNTER — Encounter: Payer: Self-pay | Admitting: Family Medicine

## 2018-11-05 ENCOUNTER — Ambulatory Visit: Payer: Self-pay | Admitting: Surgery

## 2018-11-05 NOTE — H&P (Signed)
Surgical H&P  CC: inguinal hernia  HPI: This is an otherwise healthy 33 year old gentleman who presents with a recently discovered left inguinal hernia. He has had symptoms for about a month and notes that about 3 weeks ago he started experiencing pain/ discomfort in his groin while having a bowel movement. No preceding injury or event. He then began having moderate discomfort with certain activities, and really noticed an increase in pain after he changed the rotors and brakes on his wife's car which required him to pretty aggressively strike one of the rotors to loosen it. Subsequent to this, he noticed a bulge one day when he was getting out of the shower. 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 He is now noting general discomfort in the left groin and it wraps up around his left flank.  He has been treating the pain with Tylenol and ibuprofen. His primary care doctor did give him a prescription for tramadol and he tried this but it made him very nauseated.  No prior abdominal surgery but he has had an ACL repair. Not a smoker.  Underwent an ultrasound of the pelvis on February 20 confirms a small left inguinal hernia containing peritoneal fat and minimal fluid.  No Known Allergies  Past Medical History:  Diagnosis Date  . Elevated fasting glucose 03/26/2016  . Herpes genitalis in men 01/03/2013  . TMJ arthralgia 2014   + bruxism per his dentist    Past Surgical History:  Procedure Laterality Date  . ANTERIOR CRUCIATE LIGAMENT REPAIR  2002    Family History  Problem Relation Age of Onset  . Hypertension Father   . Arthritis Other   . Hypertension Other   . Diabetes Maternal Grandmother   . Cancer Neg Hx   . Early death Neg Hx   . Drug abuse Neg Hx   . Heart disease Neg Hx   . Hyperlipidemia Neg Hx   . Kidney disease Neg Hx   . Stroke Neg Hx     Social History    Socioeconomic History  . Marital status: Married    Spouse name: Not on file  . Number of children: Not on file  . Years of education: Not on file  . Highest education level: Not on file  Occupational History  . Not on file  Social Needs  . Financial resource strain: Not on file  . Food insecurity:    Worry: Not on file    Inability: Not on file  . Transportation needs:    Medical: Not on file    Non-medical: Not on file  Tobacco Use  . Smoking status: Former Smoker    Packs/day: 0.05    Years: 1.00    Pack years: 0.05  . Smokeless tobacco: Never Used  Substance and Sexual Activity  . Alcohol use: Yes    Alcohol/week: 5.0 standard drinks    Types: 5 Cans of beer per week  . Drug use: No  . Sexual activity: Yes  Lifestyle  . Physical activity:    Days per week: Not on file    Minutes per session: Not on file  . Stress: Not on file  Relationships  . Social connections:    Talks on phone: Not on file    Gets together: Not on file    Attends religious service: Not on file    Active member of  club or organization: Not on file    Attends meetings of clubs or organizations: Not on file    Relationship status: Not on file  Other Topics Concern  . Not on file  Social History Narrative   ** Merged History Encounter **        Current Outpatient Medications on File Prior to Visit  Medication Sig Dispense Refill  . traMADol (ULTRAM) 50 MG tablet Take 1 tablet (50 mg total) by mouth every 12 (twelve) hours as needed for up to 5 days for severe pain. 10 tablet 0   No current facility-administered medications on file prior to visit.     Review of Systems: a complete, 10pt review of systems was completed with pertinent positives and negatives as documented in the HPI  Physical Exam:  11/05/2018 3:11 PM Weight: 150.2 lb Height: 71in Body Surface Area: 1.87 m Body Mass Index: 20.95 kg/m  Temp.: 98.37F  Pulse: 88 (Regular)  BP: 122/84 (Sitting, Left Arm,  Standard)  Gen: alert and well appearing Eye: extraocular motion intact, no scleral icterus ENT: moist mucus membranes, dentition intact Neck: no mass or thyromegaly Chest: unlabored respirations, symmetrical air entry, clear bilaterally CV: regular rate and rhythm, no pedal edema Abdomen: soft, nontender, nondistended. No mass or organomegaly. There is a reducible left inguinal hernia. No right inguinal hernia, no umbilical hernia. MSK: strength symmetrical throughout, no deformity Neuro: grossly intact, normal gait Psych: normal mood and affect, appropriate insight Skin: warm and dry, no rash or lesion on limited exam   CBC Latest Ref Rng & Units 03/31/2016 01/03/2013  WBC 3.8 - 10.8 K/uL 5.8 5.0  Hemoglobin 13.2 - 17.1 g/dL 13.9 14.4  Hematocrit 38.5 - 50.0 % 41.2 41.7  Platelets 140 - 400 K/uL 251 232.0    CMP Latest Ref Rng & Units 03/31/2016 01/03/2013  Glucose 65 - 99 mg/dL 85 104(H)  BUN 7 - 25 mg/dL 14 15  Creatinine 0.60 - 1.35 mg/dL 1.00 1.0  Sodium 135 - 146 mmol/L 139 141  Potassium 3.5 - 5.3 mmol/L 4.1 4.9  Chloride 98 - 110 mmol/L 101 104  CO2 20 - 31 mmol/L 27 30  Calcium 8.6 - 10.3 mg/dL 9.4 9.5  Total Protein 6.1 - 8.1 g/dL 6.7 6.9  Total Bilirubin 0.2 - 1.2 mg/dL 0.7 0.7  Alkaline Phos 40 - 115 U/L 59 57  AST 10 - 40 U/L 16 20  ALT 9 - 46 U/L 10 16    No results found for: INR, PROTIME  Imaging: No results found.   A/P: LEFT INGUINAL HERNIA (K40.90) Story: Reducible. Symptomatic with pain. We discussed options including open and laparoscopic repair. Discussed the technique of each and the risks of bleeding, infection, pain, scarring, injury to intra-abdominal or retroperitoneal structures such as bowel, bladder, blood vessels, vas deferens, nerves which can result in chronic pain or numbness, risk of hernia recurrence. Given that this is a unilateral nonrecurrent I typically recommend an open approach. We also discussed the option of nonoperative management  with hernia belt and observation, but I counseled this patient that the majority of patients end up returning and having surgery within 5 years due to worsening symptoms. Questions were welcomed and answered. Patient would like to proceed with open left inguinal hernia repair.   Romana Juniper, MD [facility], [place] Pager [phone]

## 2018-11-10 ENCOUNTER — Encounter: Payer: Self-pay | Admitting: Family Medicine

## 2018-12-01 ENCOUNTER — Inpatient Hospital Stay (HOSPITAL_COMMUNITY): Admission: RE | Admit: 2018-12-01 | Payer: Self-pay | Source: Ambulatory Visit

## 2018-12-01 ENCOUNTER — Encounter (HOSPITAL_BASED_OUTPATIENT_CLINIC_OR_DEPARTMENT_OTHER): Payer: Self-pay | Admitting: *Deleted

## 2018-12-01 ENCOUNTER — Other Ambulatory Visit: Payer: Self-pay

## 2018-12-07 NOTE — Progress Notes (Signed)
Patient given pre surgery drink and surgical soap with instructions.  Patient verbalized understanding.

## 2018-12-07 NOTE — H&P (Signed)
Surgical H&P  CC: inguinal hernia  HPI: This is an otherwise healthy 33 year old gentleman who presents with a recently discovered left inguinal hernia. He has had symptoms for about a month and notes that about 3 weeks ago he started experiencing pain/ discomfort in his groin while having a bowel movement. No preceding injury or event. He then began having moderate discomfort with certain activities, and really noticed an increase in pain after he changed the rotors and brakes on his wife's car which required him to pretty aggressively strike one of the rotors to loosen it. Subsequent to this, he noticed a bulge one day when he was getting out of the shower. 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 He is now noting general discomfort in the left groin and it wraps up around his left flank.  He has been treating the pain with Tylenol and ibuprofen. His primary care doctor did give him a prescription for tramadol and he tried this but it made him very nauseated.  No prior abdominal surgery but he has had an ACL repair. Not a smoker.  Underwent an ultrasound of the pelvis on February 20 confirms a small left inguinal hernia containing peritoneal fat and minimal fluid.  No Known Allergies  Past Medical History:  Diagnosis Date  . Elevated fasting glucose 03/26/2016  . Herpes genitalis in men 01/03/2013  . TMJ arthralgia 2014   + bruxism per his dentist         Past Surgical History:  Procedure Laterality Date  . ANTERIOR CRUCIATE LIGAMENT REPAIR  2002         Family History  Problem Relation Age of Onset  . Hypertension Father   . Arthritis Other   . Hypertension Other   . Diabetes Maternal Grandmother   . Cancer Neg Hx   . Early death Neg Hx   . Drug abuse Neg Hx   . Heart disease Neg Hx   . Hyperlipidemia Neg Hx   . Kidney disease Neg Hx   .  Stroke Neg Hx     Social History        Socioeconomic History  . Marital status: Married    Spouse name: Not on file  . Number of children: Not on file  . Years of education: Not on file  . Highest education level: Not on file  Occupational History  . Not on file  Social Needs  . Financial resource strain: Not on file  . Food insecurity:    Worry: Not on file    Inability: Not on file  . Transportation needs:    Medical: Not on file    Non-medical: Not on file  Tobacco Use  . Smoking status: Former Smoker    Packs/day: 0.05    Years: 1.00    Pack years: 0.05  . Smokeless tobacco: Never Used  Substance and Sexual Activity  . Alcohol use: Yes    Alcohol/week: 5.0 standard drinks    Types: 5 Cans of beer per week  . Drug use: No  . Sexual activity: Yes  Lifestyle  . Physical activity:    Days per week: Not on file    Minutes per session: Not on file  . Stress: Not on file  Relationships  . Social connections:    Talks on phone: Not on file    Gets together: Not on file  Attends religious service: Not on file    Active member of club or organization: Not on file    Attends meetings of clubs or organizations: Not on file    Relationship status: Not on file  Other Topics Concern  . Not on file  Social History Narrative   ** Merged History Encounter **              Current Outpatient Medications on File Prior to Visit  Medication Sig Dispense Refill  . traMADol (ULTRAM) 50 MG tablet Take 1 tablet (50 mg total) by mouth every 12 (twelve) hours as needed for up to 5 days for severe pain. 10 tablet 0   No current facility-administered medications on file prior to visit.     Review of Systems: a complete, 10pt review of systems was completed with pertinent positives and negatives as documented in the HPI  Physical Exam:  11/05/2018 3:11 PM Weight: 150.2 lb Height: 71in Body Surface Area: 1.87 m Body Mass  Index: 20.95 kg/m  Temp.: 98.72F  Pulse: 88 (Regular)  BP: 122/84 (Sitting, Left Arm, Standard)  Gen: alert and well appearing Eye: extraocular motion intact, no scleral icterus ENT: moist mucus membranes, dentition intact Neck: no mass or thyromegaly Chest: unlabored respirations, symmetrical air entry, clear bilaterally CV: regular rate and rhythm, no pedal edema Abdomen: soft, nontender, nondistended. No mass or organomegaly. There is a reducible left inguinal hernia. No right inguinal hernia, no umbilical hernia. MSK: strength symmetrical throughout, no deformity Neuro: grossly intact, normal gait Psych: normal mood and affect, appropriate insight Skin: warm and dry, no rash or lesion on limited exam   CBC Latest Ref Rng & Units 03/31/2016 01/03/2013  WBC 3.8 - 10.8 K/uL 5.8 5.0  Hemoglobin 13.2 - 17.1 g/dL 13.9 14.4  Hematocrit 38.5 - 50.0 % 41.2 41.7  Platelets 140 - 400 K/uL 251 232.0    CMP Latest Ref Rng & Units 03/31/2016 01/03/2013  Glucose 65 - 99 mg/dL 85 104(H)  BUN 7 - 25 mg/dL 14 15  Creatinine 0.60 - 1.35 mg/dL 1.00 1.0  Sodium 135 - 146 mmol/L 139 141  Potassium 3.5 - 5.3 mmol/L 4.1 4.9  Chloride 98 - 110 mmol/L 101 104  CO2 20 - 31 mmol/L 27 30  Calcium 8.6 - 10.3 mg/dL 9.4 9.5  Total Protein 6.1 - 8.1 g/dL 6.7 6.9  Total Bilirubin 0.2 - 1.2 mg/dL 0.7 0.7  Alkaline Phos 40 - 115 U/L 59 57  AST 10 - 40 U/L 16 20  ALT 9 - 46 U/L 10 16    RecentLabs  No results found for: INR, PROTIME    Imaging: ImagingResults(Last48hours)  No results found.     A/P: LEFT INGUINAL HERNIA (K40.90) Story: Reducible. Symptomatic with pain. We discussed options including open and laparoscopic repair. Discussed the technique of each and the risks of bleeding, infection, pain, scarring, injury to intra-abdominal or retroperitoneal structures such as bowel, bladder, blood vessels, vas deferens, nerves which can result in chronic pain or numbness, risk of  hernia recurrence. Given that this is a unilateral nonrecurrent I typically recommend an open approach. We also discussed the option of nonoperative management with hernia belt and observation, but I counseled this patient that the majority of patients end up returning and having surgery within 5 years due to worsening symptoms. Questions were welcomed and answered. Patient would like to proceed with open left inguinal hernia repair.   Romana Juniper, MD [facility], [place] Pager [phone]

## 2018-12-07 NOTE — Anesthesia Preprocedure Evaluation (Addendum)
Anesthesia Evaluation    Reviewed: Allergy & Precautions, H&P , Patient's Chart, lab work & pertinent test results  History of Anesthesia Complications (+) PONV and history of anesthetic complications  Airway Mallampati: I  TM Distance: >3 FB Neck ROM: Full   Comment: TMJ ? Dental no notable dental hx. (+) Teeth Intact, Dental Advisory Given   Pulmonary neg pulmonary ROS, former smoker,    Pulmonary exam normal breath sounds clear to auscultation       Cardiovascular Exercise Tolerance: Good negative cardio ROS Normal cardiovascular exam Rhythm:Regular Rate:Normal     Neuro/Psych negative neurological ROS  negative psych ROS   GI/Hepatic negative GI ROS, Neg liver ROS,   Endo/Other  negative endocrine ROS  Renal/GU negative Renal ROS  negative genitourinary   Musculoskeletal   Abdominal   Peds  Hematology negative hematology ROS (+)   Anesthesia Other Findings   Reproductive/Obstetrics negative OB ROS                            Anesthesia Physical Anesthesia Plan  ASA: II  Anesthesia Plan: General   Post-op Pain Management:    Induction: Intravenous  PONV Risk Score and Plan: 3 and Ondansetron, Dexamethasone, Treatment may vary due to age or medical condition, Scopolamine patch - Pre-op and Midazolam  Airway Management Planned: Oral ETT  Additional Equipment:   Intra-op Plan:   Post-operative Plan: Extubation in OR  Informed Consent: I have reviewed the patients History and Physical, chart, labs and discussed the procedure including the risks, benefits and alternatives for the proposed anesthesia with the patient or authorized representative who has indicated his/her understanding and acceptance.       Plan Discussed with: CRNA, Anesthesiologist and Surgeon  Anesthesia Plan Comments: (  )       Anesthesia Quick Evaluation

## 2018-12-08 ENCOUNTER — Encounter (HOSPITAL_BASED_OUTPATIENT_CLINIC_OR_DEPARTMENT_OTHER): Payer: Self-pay | Admitting: *Deleted

## 2018-12-08 ENCOUNTER — Other Ambulatory Visit: Payer: Self-pay

## 2018-12-08 ENCOUNTER — Ambulatory Visit (HOSPITAL_BASED_OUTPATIENT_CLINIC_OR_DEPARTMENT_OTHER)
Admission: RE | Admit: 2018-12-08 | Discharge: 2018-12-08 | Disposition: A | Discharge status: Home / Self Care | Payer: 59 | Attending: Surgery | Admitting: Surgery

## 2018-12-08 ENCOUNTER — Ambulatory Visit (HOSPITAL_BASED_OUTPATIENT_CLINIC_OR_DEPARTMENT_OTHER): Payer: 59 | Admitting: Anesthesiology

## 2018-12-08 ENCOUNTER — Encounter (HOSPITAL_BASED_OUTPATIENT_CLINIC_OR_DEPARTMENT_OTHER)
Admission: RE | Disposition: A | Discharge status: Home / Self Care | Payer: Self-pay | Source: Home / Self Care | Attending: Surgery

## 2018-12-08 DIAGNOSIS — Z833 Family history of diabetes mellitus: Secondary | ICD-10-CM | POA: Insufficient documentation

## 2018-12-08 DIAGNOSIS — D176 Benign lipomatous neoplasm of spermatic cord: Secondary | ICD-10-CM | POA: Insufficient documentation

## 2018-12-08 DIAGNOSIS — K4090 Unilateral inguinal hernia, without obstruction or gangrene, not specified as recurrent: Secondary | ICD-10-CM | POA: Insufficient documentation

## 2018-12-08 DIAGNOSIS — Z87891 Personal history of nicotine dependence: Secondary | ICD-10-CM | POA: Diagnosis not present

## 2018-12-08 DIAGNOSIS — Z8249 Family history of ischemic heart disease and other diseases of the circulatory system: Secondary | ICD-10-CM | POA: Insufficient documentation

## 2018-12-08 DIAGNOSIS — Z8261 Family history of arthritis: Secondary | ICD-10-CM | POA: Diagnosis not present

## 2018-12-08 HISTORY — DX: Nausea with vomiting, unspecified: Z98.890

## 2018-12-08 HISTORY — PX: INGUINAL HERNIA REPAIR: SHX194

## 2018-12-08 HISTORY — DX: Other specified postprocedural states: R11.2

## 2018-12-08 SURGERY — REPAIR, HERNIA, INGUINAL, ADULT
Anesthesia: General | Site: Inguinal | Laterality: Left

## 2018-12-08 MED ORDER — BUPIVACAINE-EPINEPHRINE (PF) 0.5% -1:200000 IJ SOLN
INTRAMUSCULAR | Status: DC | PRN
  Administered 2018-12-08: 20 mL

## 2018-12-08 MED ORDER — MIDAZOLAM HCL 2 MG/2ML IJ SOLN
INTRAMUSCULAR | Status: AC
  Filled 2018-12-08: qty 2

## 2018-12-08 MED ORDER — BUPIVACAINE LIPOSOME 1.3 % IJ SUSP
20.0000 mL | Freq: Once | INTRAMUSCULAR | Status: AC
  Administered 2018-12-08: 10 mL

## 2018-12-08 MED ORDER — CEFAZOLIN SODIUM-DEXTROSE 2-4 GM/100ML-% IV SOLN
2.0000 g | INTRAVENOUS | Status: AC
  Administered 2018-12-08: 2 g via INTRAVENOUS

## 2018-12-08 MED ORDER — FENTANYL CITRATE (PF) 100 MCG/2ML IJ SOLN
25.0000 ug | INTRAMUSCULAR | Status: DC | PRN
  Administered 2018-12-08: 10:00:00 25 ug via INTRAVENOUS
  Administered 2018-12-08: 50 ug via INTRAVENOUS
  Administered 2018-12-08: 25 ug via INTRAVENOUS

## 2018-12-08 MED ORDER — PROPOFOL 10 MG/ML IV BOLUS
INTRAVENOUS | Status: AC
  Filled 2018-12-08: qty 40

## 2018-12-08 MED ORDER — BUPIVACAINE HCL (PF) 0.25 % IJ SOLN
INTRAMUSCULAR | Status: AC
  Filled 2018-12-08: qty 30

## 2018-12-08 MED ORDER — MEPERIDINE HCL 25 MG/ML IJ SOLN: 6.2500 mg | INTRAMUSCULAR | Status: DC | PRN

## 2018-12-08 MED ORDER — LIDOCAINE 2% (20 MG/ML) 5 ML SYRINGE
INTRAMUSCULAR | Status: DC | PRN
  Administered 2018-12-08: 60 mg via INTRAVENOUS

## 2018-12-08 MED ORDER — DOCUSATE SODIUM 100 MG PO CAPS: 100.0000 mg | ORAL_CAPSULE | Freq: Two times a day (BID) | ORAL | 0 refills | Status: AC

## 2018-12-08 MED ORDER — MIDAZOLAM HCL 2 MG/2ML IJ SOLN
1.0000 mg | INTRAMUSCULAR | Status: DC | PRN
  Administered 2018-12-08: 07:00:00 2 mg via INTRAVENOUS

## 2018-12-08 MED ORDER — LIDOCAINE-EPINEPHRINE (PF) 1 %-1:200000 IJ SOLN
INTRAMUSCULAR | Status: AC
  Filled 2018-12-08: qty 30

## 2018-12-08 MED ORDER — CHLORHEXIDINE GLUCONATE 4 % EX LIQD: 60.0000 mL | Freq: Once | CUTANEOUS | Status: DC

## 2018-12-08 MED ORDER — IBUPROFEN 200 MG PO TABS: 400.0000 mg | ORAL_TABLET | Freq: Three times a day (TID) | ORAL | 0 refills | Status: DC | PRN

## 2018-12-08 MED ORDER — PROPOFOL 10 MG/ML IV BOLUS
INTRAVENOUS | Status: DC | PRN
  Administered 2018-12-08: 180 mg via INTRAVENOUS

## 2018-12-08 MED ORDER — BUPIVACAINE-EPINEPHRINE (PF) 0.5% -1:200000 IJ SOLN
INTRAMUSCULAR | Status: AC
  Filled 2018-12-08: qty 30

## 2018-12-08 MED ORDER — GABAPENTIN 300 MG PO CAPS: 300.0000 mg | ORAL_CAPSULE | Freq: Three times a day (TID) | ORAL | 0 refills | Status: DC

## 2018-12-08 MED ORDER — ACETAMINOPHEN 500 MG PO TABS
1000.0000 mg | ORAL_TABLET | ORAL | Status: AC
  Administered 2018-12-08: 1000 mg via ORAL

## 2018-12-08 MED ORDER — CELECOXIB 200 MG PO CAPS
ORAL_CAPSULE | ORAL | Status: AC
  Filled 2018-12-08: qty 1

## 2018-12-08 MED ORDER — FENTANYL CITRATE (PF) 100 MCG/2ML IJ SOLN
INTRAMUSCULAR | Status: DC | PRN
  Administered 2018-12-08: 50 ug via INTRAVENOUS
  Administered 2018-12-08: 25 ug via INTRAVENOUS

## 2018-12-08 MED ORDER — FENTANYL CITRATE (PF) 100 MCG/2ML IJ SOLN
50.0000 ug | INTRAMUSCULAR | Status: DC | PRN
  Administered 2018-12-08: 100 ug via INTRAVENOUS

## 2018-12-08 MED ORDER — GABAPENTIN 300 MG PO CAPS
300.0000 mg | ORAL_CAPSULE | ORAL | Status: AC
  Administered 2018-12-08: 300 mg via ORAL

## 2018-12-08 MED ORDER — FENTANYL CITRATE (PF) 100 MCG/2ML IJ SOLN
INTRAMUSCULAR | Status: AC
  Filled 2018-12-08: qty 2

## 2018-12-08 MED ORDER — CEFAZOLIN SODIUM-DEXTROSE 2-4 GM/100ML-% IV SOLN
INTRAVENOUS | Status: AC
  Filled 2018-12-08: qty 100

## 2018-12-08 MED ORDER — BUPIVACAINE-EPINEPHRINE 0.25% -1:200000 IJ SOLN
INTRAMUSCULAR | Status: DC | PRN
  Administered 2018-12-08: 18 mL

## 2018-12-08 MED ORDER — GABAPENTIN 300 MG PO CAPS
ORAL_CAPSULE | ORAL | Status: AC
  Filled 2018-12-08: qty 1

## 2018-12-08 MED ORDER — BUPIVACAINE-EPINEPHRINE (PF) 0.25% -1:200000 IJ SOLN
INTRAMUSCULAR | Status: AC
  Filled 2018-12-08: qty 30

## 2018-12-08 MED ORDER — DEXAMETHASONE SODIUM PHOSPHATE 10 MG/ML IJ SOLN
INTRAMUSCULAR | Status: AC
  Filled 2018-12-08: qty 1

## 2018-12-08 MED ORDER — DEXAMETHASONE SODIUM PHOSPHATE 10 MG/ML IJ SOLN
INTRAMUSCULAR | Status: DC | PRN
  Administered 2018-12-08: 10 mg via INTRAVENOUS

## 2018-12-08 MED ORDER — CELECOXIB 200 MG PO CAPS
200.0000 mg | ORAL_CAPSULE | ORAL | Status: AC
  Administered 2018-12-08: 200 mg via ORAL

## 2018-12-08 MED ORDER — SCOPOLAMINE 1 MG/3DAYS TD PT72
1.0000 | MEDICATED_PATCH | Freq: Once | TRANSDERMAL | Status: DC | PRN
  Administered 2018-12-08: 1.5 mg via TRANSDERMAL

## 2018-12-08 MED ORDER — SCOPOLAMINE 1 MG/3DAYS TD PT72
MEDICATED_PATCH | TRANSDERMAL | Status: AC
  Filled 2018-12-08: qty 1

## 2018-12-08 MED ORDER — ACETAMINOPHEN 160 MG/5ML PO SOLN: 325.0000 mg | ORAL | Status: DC | PRN

## 2018-12-08 MED ORDER — ONDANSETRON HCL 4 MG/2ML IJ SOLN: 4.0000 mg | Freq: Once | INTRAMUSCULAR | Status: DC | PRN

## 2018-12-08 MED ORDER — ONDANSETRON HCL 4 MG/2ML IJ SOLN
INTRAMUSCULAR | Status: AC
  Filled 2018-12-08: qty 2

## 2018-12-08 MED ORDER — HEPARIN SOD (PORK) LOCK FLUSH 100 UNIT/ML IV SOLN
INTRAVENOUS | Status: AC
  Filled 2018-12-08: qty 5

## 2018-12-08 MED ORDER — LIDOCAINE 2% (20 MG/ML) 5 ML SYRINGE
INTRAMUSCULAR | Status: AC
  Filled 2018-12-08: qty 5

## 2018-12-08 MED ORDER — ONDANSETRON HCL 4 MG/2ML IJ SOLN
INTRAMUSCULAR | Status: DC | PRN
  Administered 2018-12-08: 4 mg via INTRAVENOUS

## 2018-12-08 MED ORDER — OXYCODONE HCL 5 MG PO TABS: 5.0000 mg | ORAL_TABLET | Freq: Once | ORAL | Status: DC | PRN

## 2018-12-08 MED ORDER — OXYCODONE HCL 5 MG PO TABS: 5.0000 mg | ORAL_TABLET | Freq: Three times a day (TID) | ORAL | 0 refills | Status: DC | PRN

## 2018-12-08 MED ORDER — ACETAMINOPHEN 325 MG PO TABS: 650.0000 mg | ORAL_TABLET | Freq: Four times a day (QID) | ORAL | 2 refills | Status: AC

## 2018-12-08 MED ORDER — ACETAMINOPHEN 325 MG PO TABS: 650.0000 mg | ORAL_TABLET | ORAL | 2 refills | Status: DC | PRN

## 2018-12-08 MED ORDER — ACETAMINOPHEN 500 MG PO TABS
ORAL_TABLET | ORAL | Status: AC
  Filled 2018-12-08: qty 2

## 2018-12-08 MED ORDER — OXYCODONE HCL 5 MG/5ML PO SOLN: 5.0000 mg | Freq: Once | ORAL | Status: DC | PRN

## 2018-12-08 MED ORDER — LACTATED RINGERS IV SOLN
INTRAVENOUS | Status: DC
  Administered 2018-12-08: 07:00:00 via INTRAVENOUS

## 2018-12-08 MED ORDER — NAPROXEN SODIUM 220 MG PO TABS: 440.0000 mg | ORAL_TABLET | Freq: Two times a day (BID) | ORAL | Status: DC | PRN

## 2018-12-08 MED ORDER — MIDAZOLAM HCL 2 MG/2ML IJ SOLN
INTRAMUSCULAR | Status: DC | PRN
  Administered 2018-12-08: 2 mg via INTRAVENOUS

## 2018-12-08 MED ORDER — ACETAMINOPHEN 325 MG PO TABS: 325.0000 mg | ORAL_TABLET | ORAL | Status: DC | PRN

## 2018-12-08 SURGICAL SUPPLY — 55 items
APL PRP STRL LF DISP 70% ISPRP (MISCELLANEOUS) ×1
APL SKNCLS STERI-STRIP NONHPOA (GAUZE/BANDAGES/DRESSINGS) ×1
BENZOIN TINCTURE PRP APPL 2/3 (GAUZE/BANDAGES/DRESSINGS) ×3 IMPLANT
BLADE CLIPPER SURG (BLADE) IMPLANT
BLADE SURG 15 STRL LF DISP TIS (BLADE) ×1 IMPLANT
BLADE SURG 15 STRL SS (BLADE) ×3
CHLORAPREP W/TINT 26 (MISCELLANEOUS) ×3 IMPLANT
CLOSURE WOUND 1/2 X4 (GAUZE/BANDAGES/DRESSINGS) ×1
COVER BACK TABLE REUSABLE LG (DRAPES) ×3 IMPLANT
COVER MAYO STAND REUSABLE (DRAPES) ×3 IMPLANT
COVER WAND RF STERILE (DRAPES) IMPLANT
DECANTER SPIKE VIAL GLASS SM (MISCELLANEOUS) IMPLANT
DRAIN PENROSE 1/2X12 LTX STRL (WOUND CARE) IMPLANT
DRAPE LAPAROTOMY 100X72 PEDS (DRAPES) ×3 IMPLANT
DRAPE UTILITY XL STRL (DRAPES) ×3 IMPLANT
DRSG TEGADERM 4X4.75 (GAUZE/BANDAGES/DRESSINGS) ×3 IMPLANT
ELECT COATED BLADE 2.86 ST (ELECTRODE) ×3 IMPLANT
ELECT REM PT RETURN 9FT ADLT (ELECTROSURGICAL) ×3
ELECTRODE REM PT RTRN 9FT ADLT (ELECTROSURGICAL) ×1 IMPLANT
GAUZE 4X4 16PLY RFD (DISPOSABLE) ×3 IMPLANT
GAUZE SPONGE 4X4 12PLY STRL (GAUZE/BANDAGES/DRESSINGS) ×3 IMPLANT
GLOVE BIO SURGEON STRL SZ 6 (GLOVE) ×3 IMPLANT
GLOVE BIOGEL PI IND STRL 6.5 (GLOVE) ×1 IMPLANT
GLOVE BIOGEL PI IND STRL 7.0 (GLOVE) IMPLANT
GLOVE BIOGEL PI IND STRL 7.5 (GLOVE) IMPLANT
GLOVE BIOGEL PI IND STRL 8 (GLOVE) IMPLANT
GLOVE BIOGEL PI INDICATOR 6.5 (GLOVE) ×4
GLOVE BIOGEL PI INDICATOR 7.0 (GLOVE) ×2
GLOVE BIOGEL PI INDICATOR 7.5 (GLOVE) ×2
GLOVE BIOGEL PI INDICATOR 8 (GLOVE) ×2
GOWN STRL REUS W/ TWL LRG LVL3 (GOWN DISPOSABLE) ×2 IMPLANT
GOWN STRL REUS W/TWL LRG LVL3 (GOWN DISPOSABLE) ×6
MESH ULTRAPRO 3X6 7.6X15CM (Mesh General) ×2 IMPLANT
NDL HYPO 25X1 1.5 SAFETY (NEEDLE) ×1 IMPLANT
NEEDLE HYPO 25X1 1.5 SAFETY (NEEDLE) ×3 IMPLANT
NS IRRIG 1000ML POUR BTL (IV SOLUTION) ×3 IMPLANT
PACK BASIN DAY SURGERY FS (CUSTOM PROCEDURE TRAY) ×3 IMPLANT
PENCIL BUTTON HOLSTER BLD 10FT (ELECTRODE) ×3 IMPLANT
SLEEVE SCD COMPRESS KNEE MED (MISCELLANEOUS) ×3 IMPLANT
STRIP CLOSURE SKIN 1/2X4 (GAUZE/BANDAGES/DRESSINGS) ×2 IMPLANT
SUT ETHIBOND 0 MO6 C/R (SUTURE) ×3 IMPLANT
SUT MNCRL AB 4-0 PS2 18 (SUTURE) ×3 IMPLANT
SUT SILK 3 0 TIES 17X18 (SUTURE)
SUT SILK 3-0 18XBRD TIE BLK (SUTURE) IMPLANT
SUT VIC AB 0 SH 27 (SUTURE) ×3 IMPLANT
SUT VIC AB 2-0 SH 27 (SUTURE) ×3
SUT VIC AB 2-0 SH 27XBRD (SUTURE) ×1 IMPLANT
SUT VIC AB 3-0 SH 27 (SUTURE) ×3
SUT VIC AB 3-0 SH 27X BRD (SUTURE) ×1 IMPLANT
SUT VICRYL AB 2 0 TIE (SUTURE) IMPLANT
SUT VICRYL AB 2 0 TIES (SUTURE) ×3
SYR CONTROL 10ML LL (SYRINGE) ×3 IMPLANT
TOWEL GREEN STERILE FF (TOWEL DISPOSABLE) ×3 IMPLANT
TRAY FOLEY CATH 14FR (SET/KITS/TRAYS/PACK) ×2 IMPLANT
TRAY FOLEY W/BAG SLVR 14FR LF (SET/KITS/TRAYS/PACK) ×3 IMPLANT

## 2018-12-08 NOTE — Interval H&P Note (Signed)
History and Physical Interval Note:  12/08/2018 7:59 AM  Evan Lee  has presented today for surgery, with the diagnosis of LEFT INGUINAL HERNIA.  The various methods of treatment have been discussed with the patient and family. After consideration of risks, benefits and other options for treatment, the patient has consented to  Procedure(s): OPEN LEFT INGUINAL HERNIA REPAIR WITH MESH (Left) as a surgical intervention.  The patient's history has been reviewed, patient examined, no change in status, stable for surgery.  I have reviewed the patient's chart and labs.  Questions were answered to the patient's satisfaction.     Chelsea Rich Brave

## 2018-12-08 NOTE — Anesthesia Procedure Notes (Signed)
Procedure Name: LMA Insertion Date/Time: 12/08/2018 8:25 AM Performed by: Suan Halter, CRNA Pre-anesthesia Checklist: Patient identified, Emergency Drugs available, Suction available and Patient being monitored Patient Re-evaluated:Patient Re-evaluated prior to induction Oxygen Delivery Method: Circle system utilized Preoxygenation: Pre-oxygenation with 100% oxygen Induction Type: IV induction Ventilation: Mask ventilation without difficulty LMA: LMA inserted LMA Size: 4.0 Number of attempts: 1 Airway Equipment and Method: Bite block Placement Confirmation: positive ETCO2 Tube secured with: Tape Dental Injury: Teeth and Oropharynx as per pre-operative assessment

## 2018-12-08 NOTE — Discharge Instructions (Signed)
HERNIA REPAIR: POST OP INSTRUCTIONS  ######################################################################  EAT Gradually transition to a high fiber diet with a fiber supplement over the next few weeks after discharge.  Start with a pureed / full liquid diet (see below)  WALK Walk an hour a day.  Control your pain to do that.    CONTROL PAIN Control pain so that you can walk, sleep, tolerate sneezing/coughing, and go up/down stairs.  HAVE A BOWEL MOVEMENT DAILY Keep your bowels regular to avoid problems.  OK to try a laxative to override constipation.  OK to use an antidairrheal to slow down diarrhea.  Call if not better after 2 tries  CALL IF YOU HAVE PROBLEMS/CONCERNS Call if you are still struggling despite following these instructions. Call if you have concerns not answered by these instructions  ######################################################################    1. DIET: Follow a light bland diet the first 24 hours after arrival home, such as soup, liquids, crackers, etc.  Be sure to include lots of fluids daily.  Advance to a low fat / high fiber diet over the next few days after surgery.  Avoid fast food or heavy meals the first week as your are more likely to get nauseated.    2. Take your usually prescribed home medications unless otherwise directed.  3. PAIN CONTROL: a. Pain is best controlled by a usual combination of three different methods TOGETHER: i. Ice/Heat ii. Over the counter pain medication iii. Prescription pain medication b. Most patients will experience some swelling and bruising around the hernia(s) such as the bellybutton, groins, or old incisions.  Ice packs or heating pads (30-60 minutes up to 6 times a day) will help. Use ice for the first few days to help decrease swelling and bruising, then switch to heat to help relax tight/sore spots and speed recovery.  Some people prefer to use ice alone, heat alone, alternating between ice & heat.  Experiment  to what works for you.  Swelling and bruising can take several weeks to resolve.   c. It is helpful to take an over-the-counter pain medication regularly for the first few weeks: i. Naproxen (Aleve, etc)  Two 220mg  tabs twice a day OR ii. Ibuprofen (Advil, etc) Three 200mg  tabs four times a day (every meal & bedtime) AND iii. Acetaminophen (Tylenol, etc) 325-650mg  four times a day (every meal & bedtime) d. A  prescription for pain medication should be given to you upon discharge.  Take your pain medication as prescribed. Gabapentin should be taken three times a day scheduled. Only take the oxycodone if your pain is unrelieved by the above combination of ice/ heat, tylenol, NSAID and gabapentin. i. If you are having problems/concerns with the prescription medicine (does not control pain, nausea, vomiting, rash, itching, etc), please call us [phone] to see if we need to switch you to a different pain medicine that will work better for you and/or control your side effect better. ii. If you need a refill on your pain medication, please contact your pharmacy.  They will contact our office to request authorization. Prescriptions will not be filled after 5 pm or on week-ends.  4. Avoid getting constipated.  Between the surgery and the pain medications, it is common to experience some constipation.  Increasing fluid intake and taking a fiber supplement (such as Metamucil, Citrucel, FiberCon, MiraLax, etc) 1-2 times a day regularly will usually help prevent this problem from occurring.  A mild laxative (prune juice, Milk of Magnesia, MiraLax, etc) should be taken according to  package directions if there are no bowel movements after 48 hours.    5. Wash / shower every day starting 2 days after surgery.  You may shower over the steri strips.    6. Remove your outer bandage 2 days after surgery. Steri strips will peel off after 1-2 weeks. You may replace a dressing/Band-Aid to cover the incision for comfort  if you wish. You may leave the incisions open to air. Continue to shower over incision(s) after the dressing is off.  7. ACTIVITIES as tolerated:   a. You may resume regular (light) daily activities beginning the next day--such as daily self-care, walking, climbing stairs--gradually increasing activities as tolerated.  Control your pain so that you can walk an hour a day.  If you can walk 30 minutes without difficulty, it is safe to try more intense activity such as jogging, treadmill, bicycling, low-impact aerobics, swimming, etc. b. Refrain from the most intensive and strenuous activity such as sit-ups, heavy lifting, contact sports, etc  Refrain from any heavy lifting or straining (more than 20lb) until 6 weeks after surgery.   c. DO NOT PUSH THROUGH PAIN.  Let pain be your guide: If it hurts to do something, don't do it.  Pain is your body warning you to avoid that activity for another week until the pain goes down. d. You may drive when you are no longer taking prescription pain medication, you can comfortably wear a seatbelt, and you can safely maneuver your car and apply brakes. e. Dennis Bast may have sexual intercourse when it is comfortable.   8. FOLLOW UP in our office a. Please call [facility] at [phone] to set up an appointment to see your surgeon in the office for a follow-up appointment approximately 2-3 weeks after your surgery. b. Make sure that you call for this appointment the day you arrive home to insure a convenient appointment time.  9.  If you have disability of FMLA / Family leave forms, please bring the forms to the office for processing.  (do not give to your surgeon).  WHEN TO CALL us [phone]: 1. Poor pain control 2. Reactions / problems with new medications (rash/itching, nausea, etc)  3. Fever over 101.5 F (38.5 C) 4. Inability to urinate 5. Nausea and/or vomiting 6. Worsening swelling or bruising 7. Continued bleeding from incision. 8. Increased pain, redness, or  drainage from the incision   The clinic staff is available to answer your questions during regular business hours (8:30am-5pm).  Please dont hesitate to call and ask to speak to one of our nurses for clinical concerns.   If you have a medical emergency, go to the nearest emergency room or call 911.  A surgeon from [facility] is always on call at the hospitals in [facility], [place], [place], [place], [place]  [address] ?  [address] [address], [place], [place]   [address] MAIN: [phone] ? TOLL FREE: [phone] ? FAX: [phone] [email or web address]   Post Anesthesia Home Care Instructions  Activity: Get plenty of rest for the remainder of the day. A responsible individual must stay with you for 24 hours following the procedure.  For the next 24 hours, DO NOT: -Drive a car -Paediatric nurse -Drink alcoholic beverages -Take any medication unless instructed by your physician -Make any legal decisions or sign important papers.  Meals: Start with liquid foods such as gelatin or soup. Progress to regular foods as tolerated. Avoid greasy, spicy, heavy  foods. If nausea and/or vomiting occur, drink only clear liquids until the nausea and/or vomiting subsides. Call your physician if vomiting continues.  Special Instructions/Symptoms: Your throat may feel dry or sore from the anesthesia or the breathing tube placed in your throat during surgery. If this causes discomfort, gargle with warm salt water. The discomfort should disappear within 24 hours.  If you had a scopolamine patch placed behind your ear for the management of post- operative nausea and/or vomiting:  1. The medication in the patch is effective for 72 hours, after which it should be removed.  Wrap patch in a tissue and discard in the trash. Wash hands thoroughly with soap and water. 2. You may remove the patch earlier than 72 hours if you experience unpleasant  side effects which may include dry mouth, dizziness or visual disturbances. 3. Avoid touching the patch. Wash your hands with soap and water after contact with the patch.    Regional Anesthesia Blocks  1. Numbness or the inability to move the "blocked" extremity may last from 3-48 hours after placement. The length of time depends on the medication injected and your individual response to the medication. If the numbness is not going away after 48 hours, call your surgeon.  2. The extremity that is blocked will need to be protected until the numbness is gone and the  Strength has returned. Because you cannot feel it, you will need to take extra care to avoid injury. Because it may be weak, you may have difficulty moving it or using it. You may not know what position it is in without looking at it while the block is in effect.  3. For blocks in the legs and feet, returning to weight bearing and walking needs to be done carefully. You will need to wait until the numbness is entirely gone and the strength has returned. You should be able to move your leg and foot normally before you try and bear weight or walk. You will need someone to be with you when you first try to ensure you do not fall and possibly risk injury.  4. Bruising and tenderness at the needle site are common side effects and will resolve in a few days.  5. Persistent numbness or new problems with movement should be communicated to the surgeon or the [place] [phone] [place] [phone]).  Information for Discharge Teaching: EXPAREL (bupivacaine liposome injectable suspension)   Your surgeon or anesthesiologist gave you EXPAREL(bupivacaine) to help control your pain after surgery.   EXPAREL is a local anesthetic that provides pain relief by numbing the tissue around the surgical site.  EXPAREL is designed to release pain medication over time and can control pain for up to 72  hours.  Depending on how you respond to EXPAREL, you may require less pain medication during your recovery.  Possible side effects:  Temporary loss of sensation or ability to move in the area where bupivacaine was injected.  Nausea, vomiting, constipation  Rarely, numbness and tingling in your mouth or lips, lightheadedness, or anxiety may occur.  Call your doctor right away if you think you may be experiencing any of these sensations, or if you have other questions regarding possible side effects.  Follow all other discharge instructions given to you by your surgeon or nurse. Eat a healthy diet and drink plenty of water or other fluids.  If you return to the hospital for any reason within 96 hours following the administration of EXPAREL,  it is important for health care providers to know that you have received this anesthetic. A teal colored band has been placed on your arm with the date, time and amount of EXPAREL you have received in order to alert and inform your health care providers. Please leave this armband in place for the full 96 hours following administration, and then you may remove the band.

## 2018-12-08 NOTE — Anesthesia Postprocedure Evaluation (Signed)
Anesthesia Post Note  Patient: Evan Lee  Procedure(s) Performed: OPEN LEFT INGUINAL HERNIA REPAIR WITH MESH (Left Inguinal)     Patient location during evaluation: PACU Anesthesia Type: General Level of consciousness: awake and alert Pain management: pain level controlled Vital Signs Assessment: post-procedure vital signs reviewed and stable Respiratory status: spontaneous breathing, nonlabored ventilation, respiratory function stable and patient connected to nasal cannula oxygen Cardiovascular status: blood pressure returned to baseline and stable Postop Assessment: no apparent nausea or vomiting Anesthetic complications: no    Last Vitals:  Vitals:   12/08/18 1015 12/08/18 1035  BP: (!) 136/95 (!) 143/94  Pulse: 90 75  Resp: 13 18  Temp:  37 C  SpO2: 100% 99%    Last Pain:  Vitals:   12/08/18 1035  TempSrc:   PainSc: 4                  Zaleigh Bermingham

## 2018-12-08 NOTE — Progress Notes (Signed)
Assisted Dr. Ambrose Pancoast with left, ultrasound guided, transabdominal plane block. Side rails up, monitors on throughout procedure. See vital signs in flow sheet. Tolerated Procedure well.

## 2018-12-08 NOTE — Op Note (Signed)
Operative Note  Evan Lee  [id number]  [id number]  12/08/2018   Surgeon: Clovis Riley  Assistant: none  Procedure performed: Open left inguinal hernia repair with mesh, excision of cord lipoma  Preop diagnosis:  left inguinal hernia  Post-op diagnosis/intraop findings: indirect inguinal hernia, cord lipoma  Specimens: none  EBL: 5cc  Complications: none  Description of procedure: After obtaining informed consent and placement of an exparel TAPS block in holding by Dr. Ambrose Pancoast, the patient was taken to the operating room and placed supine on operating room table wheregeneral anesthesia was initiated, preoperative antibiotics were administered, SCDs applied, and a formal timeout was performed. Foley catheter inserted which is removed at the end of the case. The groin was clipped, prepped and draped in the usual sterile fashion. An oblique incision was made in the just above the inguinal ligament after infiltrating the tissues with local anesthetic. Soft tissues were dissected using electrocautery until the external oblique aponeurosis was encountered. This was divided sharply to expand the external ring. A plane was bluntly developed between the spermatic cord and the external oblique. The ilioinguinal nerve was identified, divided between hemostats and each end ligated with vicryl ties. The spermatic cord was then bluntly dissected away from the pubic tubercle and encircled with a Penrose. Inspection of the inguinal anatomy revealed a moderate indirect hernia and a moderate cord lipoma. The indirect hernia sac was bluntly dissected away from the cord structures. Once we had affirmatively identified the sac, it was carefully opened in a region that was very thin. Inspection confirmed communication with the peritoneal cavity with no bowel contained currently. The sac was then  clamped at the internal ring, the excess excised and the peritoneum was suture ligated with a 0  vicryl and reduced into the abdomen. The cord lipoma was skeletonized to a thin stalk and ligated at the internal ring with a vicryl tie and the remainder excised. A 3 x 6 piece of ultra Pro mesh was brought onto the field and trimmed to approximate the field. This was tacked to the pubic tubercle fascia using 0 vicryl. Interrupted 0 ethibonds were then used to tack the mesh to the inferior shelving edge. Superiorly the mesh was tacked to the internal oblique using interrupted 0 ethibond. The tails of the mesh were wrapped around the spermatic cord, ensuring adequate room for the cord,and tacked to each other with 0 ethibond, and then directed laterally to lie flat under the external oblique aponeurosis. Hemostasis was ensured within the wound. The Penrose was removed. The external oblique aponeurosis was reapproximated with a running 3-0 Vicryl to re-create a narrowed external ring. More local was infiltrated around the pubic tubercle and in the plane just below the external oblique. The Scarpa's was reapproximated with interrupted 2-0 Vicryls. The skin was closed with a running subcuticular Monocryl. The remainder of the local was injected in the subcutaneous and subcuticular space. The field was then cleaned, benzoin and Steri-Strips and sterile bandage were applied. Both testicles were palpated in the scrotum at the end of the case. The patient was then awakened extubated and taken to PACU in stable condition.   All counts were correct at the completion of the case

## 2018-12-08 NOTE — Anesthesia Procedure Notes (Signed)
Anesthesia Regional Block: TAP block   Pre-Anesthetic Checklist: ,, timeout performed, Correct Patient, Correct Site, Correct Laterality, Correct Procedure, Correct Position, site marked, Risks and benefits discussed,  Surgical consent,  Pre-op evaluation,  At surgeon's request and post-op pain management  Laterality: Left  Prep: chloraprep       Needles:  Injection technique: Single-shot  Needle Type: Echogenic Stimulator Needle     Needle Length: 5cm  Needle Gauge: 22     Additional Needles:   Procedures:, nerve stimulator,,, ultrasound used (permanent image in chart),,,,  Narrative:  Injection made incrementally with aspirations every 5 mL.  Performed by: Personally  Anesthesiologist: Janeece Riggers, MD  Additional Notes: Functioning IV was confirmed and monitors were applied.  A 14mm 22ga Arrow echogenic stimulator needle was used. Sterile prep and drape,hand hygiene and sterile gloves were used. Ultrasound guidance: relevant anatomy identified, needle position confirmed, local anesthetic spread visualized around nerve(s)., vascular puncture avoided.  Image printed for medical record. Negative aspiration and negative test dose prior to incremental administration of local anesthetic. The patient tolerated the procedure well.

## 2018-12-08 NOTE — Transfer of Care (Signed)
Immediate Anesthesia Transfer of Care Note  Patient: Evan Lee  Procedure(s) Performed: Procedure(s) (LRB): OPEN LEFT INGUINAL HERNIA REPAIR WITH MESH (Left)  Patient Location: PACU  Anesthesia Type: General  Level of Consciousness: awake, oriented, sedated and patient cooperative  Airway & Oxygen Therapy: Patient Spontanous Breathing and Patient connected to face mask oxygen  Post-op Assessment: Report given to PACU RN and Post -op Vital signs reviewed and stable  Post vital signs: Reviewed and stable  Complications: No apparent anesthesia complications  Last Vitals:  Vitals Value Taken Time  BP 118/72 12/08/2018  9:46 AM  Temp    Pulse 66 12/08/2018  9:48 AM  Resp 11 12/08/2018  9:48 AM  SpO2 100 % 12/08/2018  9:48 AM  Vitals shown include unvalidated device data.  Last Pain:  Vitals:   12/08/18 0648  TempSrc: Oral  PainSc: 3       Patients Stated Pain Goal: 1 (12/08/18 2956)

## 2018-12-09 ENCOUNTER — Encounter (HOSPITAL_BASED_OUTPATIENT_CLINIC_OR_DEPARTMENT_OTHER): Payer: Self-pay | Admitting: Surgery

## 2019-06-22 ENCOUNTER — Other Ambulatory Visit: Payer: Self-pay

## 2019-06-22 DIAGNOSIS — Z20822 Contact with and (suspected) exposure to covid-19: Secondary | ICD-10-CM

## 2019-06-23 LAB — NOVEL CORONAVIRUS, NAA: SARS-CoV-2, NAA: NOT DETECTED

## 2019-07-06 ENCOUNTER — Encounter: Payer: Self-pay | Admitting: Family Medicine

## 2019-07-07 ENCOUNTER — Other Ambulatory Visit: Payer: Self-pay

## 2019-07-07 DIAGNOSIS — F4321 Adjustment disorder with depressed mood: Secondary | ICD-10-CM

## 2019-07-07 DIAGNOSIS — F411 Generalized anxiety disorder: Secondary | ICD-10-CM

## 2019-08-25 ENCOUNTER — Other Ambulatory Visit: Payer: 59

## 2019-09-30 ENCOUNTER — Ambulatory Visit: Payer: 59 | Attending: Internal Medicine

## 2019-09-30 DIAGNOSIS — Z20822 Contact with and (suspected) exposure to covid-19: Secondary | ICD-10-CM

## 2019-10-01 LAB — NOVEL CORONAVIRUS, NAA: SARS-CoV-2, NAA: NOT DETECTED

## 2019-12-23 ENCOUNTER — Encounter: Payer: Self-pay | Admitting: Family Medicine

## 2020-05-24 ENCOUNTER — Ambulatory Visit (INDEPENDENT_AMBULATORY_CARE_PROVIDER_SITE_OTHER): Payer: 59 | Admitting: Podiatry

## 2020-05-24 ENCOUNTER — Ambulatory Visit (INDEPENDENT_AMBULATORY_CARE_PROVIDER_SITE_OTHER): Payer: 59

## 2020-05-24 ENCOUNTER — Other Ambulatory Visit: Payer: Self-pay

## 2020-05-24 DIAGNOSIS — M79671 Pain in right foot: Secondary | ICD-10-CM

## 2020-05-24 DIAGNOSIS — R21 Rash and other nonspecific skin eruption: Secondary | ICD-10-CM | POA: Diagnosis not present

## 2020-05-24 DIAGNOSIS — S92355K Nondisplaced fracture of fifth metatarsal bone, left foot, subsequent encounter for fracture with nonunion: Secondary | ICD-10-CM

## 2020-05-24 MED ORDER — TRIAMCINOLONE ACETONIDE 0.1 % EX CREA: 1.0000 "application " | TOPICAL_CREAM | Freq: Two times a day (BID) | CUTANEOUS | 0 refills | Status: DC

## 2020-05-24 MED ORDER — MELOXICAM 15 MG PO TABS: 15.0000 mg | ORAL_TABLET | Freq: Every day | ORAL | 0 refills | Status: DC

## 2020-05-24 NOTE — Patient Instructions (Signed)
If was nice to meet you today. If you have any questions or any further concerns, please feel fee to give me a call. You can call our office at [phone] or please feel fee to send me a message through [place].   I have ordered an MRI of your right foot. If you do not hear for them about scheduling within the next 1 week, or you have any questions please give [place] a call at [phone].

## 2020-05-27 NOTE — Progress Notes (Signed)
Subjective:   Patient ID: Evan Lee, male   DOB: 34 y.o.   MRN: [id number]   HPI 34 year old male presents the office today for concerns of pain to the side of his right foot pointing to the fifth metatarsal base.  He states that the area has been causing discomfort for the last year when he walks.  He has noticed a hard spot present.  He denies any recent injury or trauma he said no recent treatment.  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  He also has secondary concerns of a small rash on the bottom of his left foot that appears intermittently.  Prior to that this was fungus is not sure if this is eczema or other skin rash.  He said no recent treatment for this.   Review of Systems  All other systems reviewed and are negative.  Past Medical History:  Diagnosis Date  . PONV (postoperative nausea and vomiting)   . TMJ arthralgia 2014   + bruxism per his dentist    Past Surgical History:  Procedure Laterality Date  . ANTERIOR CRUCIATE LIGAMENT REPAIR  2002  . INGUINAL HERNIA REPAIR Left 12/08/2018   Procedure: OPEN LEFT INGUINAL HERNIA REPAIR WITH MESH;  Surgeon: Clovis Riley, MD;  Location: [place];  Service: General;  Laterality: Left;     Current Outpatient Medications:  .  diphenhydrAMINE (SOMINEX) 25 MG tablet, Take by mouth., Disp: , Rfl:  .  gabapentin (NEURONTIN) 300 MG capsule, Take 1 capsule (300 mg total) by mouth 3 (three) times daily., Disp: 21 capsule, Rfl: 0 .  ibuprofen (ADVIL,MOTRIN) 200 MG tablet, Take 2 tablets (400 mg total) by mouth every 8 (eight) hours as needed. Take EITHER ibuprofen OR naproxen around the clock for the first several days, then can switch to taking as needed., Disp: 30 tablet, Rfl: 0 .  meloxicam (MOBIC) 15 MG tablet, Take 1 tablet (15 mg total) by mouth daily., Disp: 30 tablet, Rfl: 0 .  naproxen sodium (ALEVE) 220 MG tablet, Take 2 tablets (440 mg total) by mouth 2 (two) times daily as needed (pain). Take  EITHER ibuprofen OR naproxen around the clock for the first several days, then can switch to taking as needed., Disp: , Rfl:  .  oxyCODONE (ROXICODONE) 5 MG immediate release tablet, Take 1 tablet (5 mg total) by mouth every 8 (eight) hours as needed., Disp: 15 tablet, Rfl: 0 .  triamcinolone cream (KENALOG) 0.1 %, Apply 1 application topically 2 (two) times daily., Disp: 30 g, Rfl: 0  No Known Allergies       Objective:  Physical Exam  General: AAO x3, NAD  Dermatological: Small rash present to the arch of the right foot.  Currently no blisters or open sores identified.  Rash is erythematous and flat.  No other skin lesions or open lesions identified.  Vascular: Dorsalis Pedis artery and Posterior Tibial artery pedal pulses are 2/4 bilateral with immedate capillary fill time. There is no pain with calf compression, swelling, warmth, erythema.   Neruologic: Grossly intact via light touch bilateral.   Musculoskeletal: There is tenderness on the fifth metatarsal base of the right foot and prominent metatarsal base.  Localized edema and straight to the lateral fifth metatarsal base.  No pain in the peroneal tendons.  No other areas of discomfort identified at this time.  Muscular strength 5/5 in all groups tested bilateral.  Gait: Unassisted, Nonantalgic.  Assessment:   34 year old male likely old fifth metatarsal base fracture; eczema     Plan:  -Treatment options discussed including all alternatives, risks, and complications -Etiology of symptoms were discussed -X-rays were obtained and reviewed with the patient.  Radiolucency present with the contents of base with well corticated edges consistent with old fractured fifth metatarsal base. -In regards to the right foot pain and the lateral post to his insert to see if this will help take pressure off of her foot.  Anti-inflammatories as needed prescribed meloxicam.  Unfortunately the prominence of oil white without surgery.  I  will order an MRI to further evaluate the nonunion of the fracture.  Offered steroid injection today. -Prescribed Kenalog cream for the rash  Trula Slade DPM

## 2020-05-31 ENCOUNTER — Telehealth: Payer: Self-pay | Admitting: *Deleted

## 2020-05-31 NOTE — Telephone Encounter (Signed)
Patient has an MRI appointment at 06-02-2020 at 2:00 pm. Evan Lee

## 2020-05-31 NOTE — Telephone Encounter (Signed)
-----   Message from Trula Slade, DPM sent at 05/27/2020  8:16 AM EDT ----- I ordered an MRI of the right foot last week. Can you see if it needs a pre-cert?

## 2020-06-02 ENCOUNTER — Ambulatory Visit
Admission: RE | Admit: 2020-06-02 | Discharge: 2020-06-02 | Disposition: A | Discharge status: Home / Self Care | Payer: 59 | Source: Ambulatory Visit | Attending: Podiatry | Admitting: Podiatry

## 2020-06-02 DIAGNOSIS — S92355K Nondisplaced fracture of fifth metatarsal bone, left foot, subsequent encounter for fracture with nonunion: Secondary | ICD-10-CM

## 2020-06-04 ENCOUNTER — Encounter: Payer: Self-pay | Admitting: Podiatry

## 2020-06-25 ENCOUNTER — Other Ambulatory Visit: Payer: Self-pay | Admitting: Podiatry

## 2020-07-05 ENCOUNTER — Ambulatory Visit: Payer: 59 | Admitting: Podiatry

## 2020-07-09 ENCOUNTER — Ambulatory Visit: Payer: 59 | Admitting: Podiatry

## 2020-07-09 ENCOUNTER — Other Ambulatory Visit: Payer: Self-pay

## 2020-07-09 DIAGNOSIS — S92354K Nondisplaced fracture of fifth metatarsal bone, right foot, subsequent encounter for fracture with nonunion: Secondary | ICD-10-CM

## 2020-07-09 DIAGNOSIS — M79671 Pain in right foot: Secondary | ICD-10-CM

## 2020-07-09 NOTE — Patient Instructions (Signed)

## 2020-07-10 NOTE — Progress Notes (Signed)
Subjective: 34 year old male presents the office today to discuss MRI results in more detail as well as for possible surgical consultation.  He states the pain to the center of foot is been ongoing.  He states that he used to work in his activities because of daily discomfort and he wants to have the area fixed.  He denies recent injury or trauma he is not sure when he broke the foot. Denies any systemic complaints such as fevers, chills, nausea, vomiting. No acute changes since last appointment, and no other complaints at this time.   Objective: AAO x3, NAD DP/PT pulses palpable bilaterally, CRT less than 3 seconds There is discomfort along the lateral aspect of fifth metatarsal base of the right foot there is minimal edema to the area.  There is no erythema or warmth.  There is no pain on the peroneal tendon.  There is no other areas of discomfort identified today.  MMT 5/5.  No open lesions or pre-ulcerative lesions.  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  -I reviewed the MRI with him which did reveal remote ununited transverse fracture to the base of the fifth metatarsal with likely fibrous union or synchondrosis. -I again reviewed the x-rays with him.  Discussed with conservative possible surgical treatment options.  Does not want to proceed with a steroid injection.  Does wear supportive shoes.-We discussed open reduction internal fixation with resection of a nonunion of the fifth metatarsal fracture.  At this point he wants to proceed with this. -The incision placement as well as the postoperative course was discussed with the patient. I discussed risks of the surgery which include, but not limited to, infection, bleeding, pain, swelling, need for further surgery, delayed or nonhealing, painful or  ugly scar, numbness or sensation changes, over/under correction, recurrence, transfer lesions, further deformity, hardware failure, DVT/PE, loss of toe/foot. Patient understands these risks and wishes to proceed with surgery. The surgical consent was reviewed with the patient all 3 pages were signed. No promises or guarantees were given to the outcome of the procedure. All questions were answered to the best of my ability. Before the surgery the patient was encouraged to call the office if there is any further questions. The surgery will be performed at the [facility] on an outpatient basis. -Patient encouraged to call the office with any questions, concerns, change in symptoms.   Trula Slade DPM

## 2020-07-17 ENCOUNTER — Telehealth: Payer: Self-pay

## 2020-07-17 NOTE — Telephone Encounter (Signed)
Evan Lee called to reschedule his surgery on 09/12/2020 with Dr. Jacqualyn Posey. He is now scheduled for 10/31/2020. I notified Caren Griffins with [facility] and Dr. Jacqualyn Posey.

## 2020-08-02 ENCOUNTER — Encounter: Payer: Self-pay | Admitting: Physician Assistant

## 2020-08-06 ENCOUNTER — Other Ambulatory Visit: Payer: Self-pay

## 2020-08-06 ENCOUNTER — Ambulatory Visit
Admission: EM | Admit: 2020-08-06 | Discharge: 2020-08-06 | Disposition: A | Discharge status: Home / Self Care | Payer: 59 | Attending: Family Medicine | Admitting: Family Medicine

## 2020-08-06 ENCOUNTER — Encounter: Payer: Self-pay | Admitting: Family Medicine

## 2020-08-06 DIAGNOSIS — J029 Acute pharyngitis, unspecified: Secondary | ICD-10-CM | POA: Insufficient documentation

## 2020-08-06 LAB — POCT RAPID STREP A (OFFICE): Rapid Strep A Screen: NEGATIVE

## 2020-08-06 MED ORDER — AMOXICILLIN 875 MG PO TABS: 875.0000 mg | ORAL_TABLET | Freq: Two times a day (BID) | ORAL | 0 refills | Status: DC

## 2020-08-06 NOTE — ED Provider Notes (Signed)
[facility]    CSN: [id number] Arrival date & time: 08/06/20  1634      History   Chief Complaint Chief Complaint  Patient presents with  . Sore Throat  . Fever    HPI Evan Lee is a 34 y.o. male.   Initial [facility] visit  Pt here for sore throat and fever starting last night; pt sts he had negative home covid test.  Symptoms began yesterday with mild sore throat and tender anterior neck a low-grade fever.  Has had no cough, no loss of sense of smell, but he has had some fatigue.  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  Son recently had strep throat     Past Medical History:  Diagnosis Date  . PONV (postoperative nausea and vomiting)   . TMJ arthralgia 2014   + bruxism per his dentist    Patient Active Problem List   Diagnosis Date Noted  . Inguinal hernia of left side without obstruction or gangrene 10/26/2018  . Acute otitis media 09/03/2018  . Acute maxillary sinusitis 09/03/2018  . Entrapment of right ulnar nerve 07/02/2018  . Numbness 07/02/2018  . Bronchitis 03/01/2018  . Myalgia 10/13/2017  . Fever 10/13/2017  . h/o Anxiety state w diff sleeping 03/30/2016  . Elevated fasting glucose 03/26/2016  . Environmental and seasonal allergies 02/07/2016  . Routine general medical examination at a health care facility 01/03/2013  . Herpes genitalis in men 01/03/2013  . TMJ arthralgia 01/03/2013  . h/o RLS (restless legs syndrome) 01/03/2013    Past Surgical History:  Procedure Laterality Date  . ANTERIOR CRUCIATE LIGAMENT REPAIR  2002  . INGUINAL HERNIA REPAIR Left 12/08/2018   Procedure: OPEN LEFT INGUINAL HERNIA REPAIR WITH MESH;  Surgeon: Clovis Riley, MD;  Location: [place];  Service: General;  Laterality: Left;       Home Medications    Prior to Admission medications   Medication Sig Start Date End Date Taking? Authorizing Provider  amoxicillin  (AMOXIL) 875 MG tablet Take 1 tablet (875 mg total) by mouth 2 (two) times daily. Take with food 08/06/20   Robyn Haber, MD  diphenhydrAMINE (SOMINEX) 25 MG tablet Take by mouth.    [provider]  ibuprofen (ADVIL,MOTRIN) 200 MG tablet Take 2 tablets (400 mg total) by mouth every 8 (eight) hours as needed. Take EITHER ibuprofen OR naproxen around the clock for the first several days, then can switch to taking as needed. 12/08/18   Clovis Riley, MD  triamcinolone cream (KENALOG) 0.1 % Apply 1 application topically 2 (two) times daily. 05/24/20   Trula Slade, DPM  gabapentin (NEURONTIN) 300 MG capsule Take 1 capsule (300 mg total) by mouth 3 (three) times daily. Patient not taking: Reported on 08/06/2020 12/08/18 08/06/20  Clovis Riley, MD    Family History Family History  Problem Relation Age of Onset  . Hypertension Father   . Arthritis Other   . Hypertension Other   . Diabetes Maternal Grandmother   . Cancer Neg Hx   . Early death Neg Hx   . Drug abuse Neg Hx   . Heart disease Neg Hx   . Hyperlipidemia Neg Hx   . Kidney disease Neg Hx   . Stroke Neg Hx     Social History Social History   Tobacco Use  . Smoking status: Former Smoker    Packs/day: 0.05    Years: 1.00  Pack years: 0.05  . Smokeless tobacco: Never Used  Vaping Use  . Vaping Use: Never used  Substance Use Topics  . Alcohol use: Yes    Alcohol/week: 5.0 standard drinks    Types: 5 Cans of beer per week  . Drug use: No     Allergies   Patient has no known allergies.   Review of Systems Review of Systems  Constitutional: Positive for fever.  HENT: Positive for sore throat.   Musculoskeletal: Positive for myalgias and neck pain.  All other systems reviewed and are negative.    Physical Exam Triage Vital Signs ED Triage Vitals  Enc Vitals Group     BP      Pulse      Resp      Temp      Temp src      SpO2      Weight      Height      Head Circumference       Peak Flow      Pain Score      Pain Loc      Pain Edu?      Excl. in [place]?    No data found.  Updated Vital Signs BP (!) 141/81 (BP Location: Left Arm)   Pulse 88   Temp 98 F (36.7 C) (Oral)   Resp 18   SpO2 99%    Physical Exam Vitals and nursing note reviewed.  Constitutional:      General: He is not in acute distress.    Appearance: He is well-developed and normal weight. He is not ill-appearing.  HENT:     Mouth/Throat:     Mouth: Mucous membranes are moist.     Pharynx: Oropharyngeal exudate and posterior oropharyngeal erythema present.     Comments: Throat is intensely red with early exudates on the right posterior pharynx Cardiovascular:     Rate and Rhythm: Normal rate.  Pulmonary:     Effort: Pulmonary effort is normal.  Musculoskeletal:     Cervical back: Normal range of motion and neck supple.  Lymphadenopathy:     Cervical: Cervical adenopathy present.  Skin:    General: Skin is warm and dry.  Neurological:     General: No focal deficit present.     Mental Status: He is alert and oriented to person, place, and time.  Psychiatric:        Mood and Affect: Mood normal.        Behavior: Behavior normal.      UC Treatments / Results  Labs (all labs ordered are listed, but only abnormal results are displayed) Labs Reviewed  CULTURE, GROUP A STREP [facility])  POCT RAPID STREP A (OFFICE)    EKG   Radiology No results found.  Procedures Procedures (including critical care time)  Medications Ordered in UC Medications - No data to display  Initial Impression / Assessment and Plan / UC Course  I have reviewed the triage vital signs and the nursing notes.  Pertinent labs & imaging results that were available during my care of the patient were reviewed by me and considered in my medical decision making (see chart for details).    Final Clinical Impressions(s) / UC Diagnoses   Final diagnoses:  Acute pharyngitis, unspecified etiology      Discharge Instructions     The rapid strep test is negative.  We are running a more accurate outpatient test that we should be back on Wednesday.  In the meantime start the amoxicillin.    ED Prescriptions    Medication Sig Dispense Auth. Provider   amoxicillin (AMOXIL) 875 MG tablet Take 1 tablet (875 mg total) by mouth 2 (two) times daily. Take with food 20 tablet Robyn Haber, MD     I have reviewed the PDMP during this encounter.   Robyn Haber, MD 08/06/20 1701

## 2020-08-06 NOTE — ED Triage Notes (Signed)
Pt here for sore throat and fever starting last night; pt sts he had negative home covid test

## 2020-08-06 NOTE — Discharge Instructions (Addendum)
The rapid strep test is negative.  We are running a more accurate outpatient test that we should be back on Wednesday.  In the meantime start the amoxicillin.

## 2020-08-09 ENCOUNTER — Encounter: Payer: Self-pay | Admitting: Physician Assistant

## 2020-08-09 LAB — CULTURE, GROUP A STREP (THRC)

## 2020-09-17 ENCOUNTER — Encounter: Payer: 59 | Admitting: Podiatry

## 2020-09-20 ENCOUNTER — Ambulatory Visit: Payer: 59 | Admitting: Physician Assistant

## 2020-09-20 ENCOUNTER — Telehealth: Payer: Self-pay

## 2020-09-20 NOTE — Telephone Encounter (Signed)
Evan Lee called to reschedule his surgery with Dr. Jacqualyn Posey on 10/31/2020. 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 We rescheduled him to 12/05/2020. Notified Cynthia with [facility] and Dr. Jacqualyn Posey.

## 2020-09-21 ENCOUNTER — Ambulatory Visit: Payer: 59 | Admitting: Physician Assistant

## 2020-09-27 ENCOUNTER — Encounter: Payer: 59 | Admitting: Podiatry

## 2020-10-03 ENCOUNTER — Ambulatory Visit: Payer: 59 | Admitting: Physician Assistant

## 2020-10-11 ENCOUNTER — Encounter: Payer: 59 | Admitting: Podiatry

## 2020-11-05 ENCOUNTER — Encounter: Payer: 59 | Admitting: Podiatry

## 2020-11-09 ENCOUNTER — Ambulatory Visit: Payer: 59 | Admitting: Nurse Practitioner

## 2020-11-09 ENCOUNTER — Other Ambulatory Visit: Payer: Self-pay

## 2020-11-09 VITALS — BP 138/85 | HR 66 | Temp 98.4°F | Ht 71.0 in | Wt 152.8 lb

## 2020-11-09 DIAGNOSIS — F411 Generalized anxiety disorder: Secondary | ICD-10-CM

## 2020-11-09 DIAGNOSIS — R5383 Other fatigue: Secondary | ICD-10-CM | POA: Diagnosis not present

## 2020-11-09 DIAGNOSIS — Z7689 Persons encountering health services in other specified circumstances: Secondary | ICD-10-CM | POA: Diagnosis not present

## 2020-11-09 DIAGNOSIS — F419 Anxiety disorder, unspecified: Secondary | ICD-10-CM

## 2020-11-09 MED ORDER — SERTRALINE HCL 50 MG PO TABS: 50.0000 mg | ORAL_TABLET | Freq: Every day | ORAL | 0 refills | Status: DC

## 2020-11-09 NOTE — Patient Instructions (Signed)
http://[email or web address].NIH.Gov">  Generalized Anxiety Disorder, Adult Generalized anxiety disorder (GAD) is a mental health condition. Unlike normal worries, anxiety related to GAD is not triggered by a specific event. These worries do not fade or get better with time. GAD interferes with relationships, work, and school. GAD symptoms can vary from mild to severe. People with severe GAD can have intense waves of anxiety with physical symptoms that are similar to panic attacks. What are the causes? The exact cause of GAD is not known, but the following are believed to have an impact:  Differences in natural brain chemicals.  Genes passed down from parents to children.  Differences in the way threats are perceived.  Development during childhood.  Personality. What increases the risk? The following factors may make you more likely to develop this condition:  Being male.  Having a family history of anxiety disorders.  Being very shy.  Experiencing very stressful life events, such as the death of a loved one.  Having a very stressful family environment. What are the signs or symptoms? People with GAD often worry excessively about many things in their lives, such as their health and family. Symptoms may also include:  Mental and emotional symptoms: ? Worrying excessively about natural disasters. ? Fear of being late. ? Difficulty concentrating. ? Fears that others are judging your performance.  Physical symptoms: ? Fatigue. ? Headaches, muscle tension, muscle twitches, trembling, or feeling shaky. ? Feeling like your heart is pounding or beating very fast. ? Feeling out of breath or like you cannot take a deep breath. ? Having trouble falling asleep or staying asleep, or experiencing restlessness. ? Sweating. ? Nausea, diarrhea, or irritable bowel syndrome (IBS).  Behavioral symptoms: ? Experiencing erratic moods or irritability. ? Avoidance of new situations. ? Avoidance of  people. ? Extreme difficulty making decisions. How is this diagnosed? This condition is diagnosed based on your symptoms and medical history. You will also have a physical exam. Your health care provider may perform tests to rule out other possible causes of your symptoms. To be diagnosed with GAD, a person must have anxiety that:  Is out of his or her control.  Affects several different aspects of his or her life, such as work and relationships.  Causes distress that makes him or her unable to take part in normal activities.  Includes at least three symptoms of GAD, such as restlessness, fatigue, trouble concentrating, irritability, muscle tension, or sleep problems. Before your health care provider can confirm a diagnosis of GAD, these symptoms must be present more days than they are not, and they must last for 6 months or longer. How is this treated? This condition may be treated with:  Medicine. Antidepressant medicine is usually prescribed for long-term daily control. Anti-anxiety medicines may be added in severe cases, especially when panic attacks occur.  Talk therapy (psychotherapy). Certain types of talk therapy can be helpful in treating GAD by providing support, education, and guidance. Options include: ? Cognitive behavioral therapy (CBT). People learn coping skills and self-calming techniques to ease their physical symptoms. They learn to identify unrealistic thoughts and behaviors and to replace them with more appropriate thoughts and behaviors. ? Acceptance and commitment therapy (ACT). This treatment teaches people how to be mindful as a way to cope with unwanted thoughts and feelings. ? Biofeedback. This process trains you to manage your body's response (physiological response) through breathing techniques and relaxation methods. You will work with a therapist while machines are used to monitor your physical  symptoms.  Stress management techniques. These include yoga,  meditation, and exercise. A mental health specialist can help determine which treatment is best for you. Some people see improvement with one type of therapy. However, other people require a combination of therapies.   Follow these instructions at home: Lifestyle  Maintain a consistent routine and schedule.  Anticipate stressful situations. Create a plan, and allow extra time to work with your plan.  Practice stress management or self-calming techniques that you have learned from your therapist or your health care provider. General instructions  Take over-the-counter and prescription medicines only as told by your health care provider.  Understand that you are likely to have setbacks. Accept this and be kind to yourself as you persist to take better care of yourself.  Recognize and accept your accomplishments, even if you judge them as small.  Keep all follow-up visits as told by your health care provider. This is important. Contact a health care provider if:  Your symptoms do not get better.  Your symptoms get worse.  You have signs of depression, such as: ? A persistently sad or irritable mood. ? Loss of enjoyment in activities that used to bring you joy. ? Change in weight or eating. ? Changes in sleeping habits. ? Avoiding friends or family members. ? Loss of energy for normal tasks. ? Feelings of guilt or worthlessness. Get help right away if:  You have serious thoughts about hurting yourself or others. If you ever feel like you may hurt yourself or others, or have thoughts about taking your own life, get help right away. Go to your nearest emergency department or:  Call your local emergency services (911 in the [place]  Call a suicide crisis helpline, such as the [place] at (318)549-9768. This is open 24 hours a day in the [place]  Text the Crisis Text Line at 402-301-2541 (in the [place].). Summary  Generalized anxiety disorder (GAD) is a mental  health condition that involves worry that is not triggered by a specific event.  People with GAD often worry excessively about many things in their lives, such as their health and family.  GAD may cause symptoms such as restlessness, trouble concentrating, sleep problems, frequent sweating, nausea, diarrhea, headaches, and trembling or muscle twitching.  A mental health specialist can help determine which treatment is best for you. Some people see improvement with one type of therapy. However, other people require a combination of therapies. This information is not intended to replace advice given to you by your health care provider. Make sure you discuss any questions you have with your health care provider. Document Revised: 06/15/2019 Document Reviewed: 06/15/2019 Elsevier Patient Education  [place].

## 2020-11-12 ENCOUNTER — Encounter: Payer: Self-pay | Admitting: Nurse Practitioner

## 2020-11-12 DIAGNOSIS — Z7689 Persons encountering health services in other specified circumstances: Secondary | ICD-10-CM | POA: Insufficient documentation

## 2020-11-12 DIAGNOSIS — R5383 Other fatigue: Secondary | ICD-10-CM | POA: Insufficient documentation

## 2020-11-12 NOTE — Progress Notes (Signed)
New Patient Office Visit  Subjective:  Patient ID: Evan Lee, male    DOB: January 04, 1986  Age: 35 y.o. MRN: [id number]  CC:  Chief Complaint  Patient presents with  . New Patient (Initial Visit)    HPI Evan Lee presents establish primary care provider. Today, he is concerned about level of depression. He states that he sees a therapist, lately these are all tele health visits. States that he used to see her once weekly, and now the visits are when needed, as patient's schedule is extremely busy. He states his therapist treats him for a combination of major depression and PTSD. Patient states that he has a family history of addiction issues. Patient states that he does have some toxic relationships with family members. He also states that six years ago, his fried passed away suddenly. The patient states that he is currently married, in a stable and happy marriage. He has two happy and healthy children. 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 The patient states that he feels like he should be happier than he currently feels. He thinks he should feel more joy in certain situations. He denies feeling suicidal at any time. He does not wish to harm anyone or himself. He denies smoking, illicit drug use or addiction. He states that he managed to avoid these problems. The patient states that at his last appointment for counseling, it was suggested that maybe he try medication to help treat anxiety/depression.  The patient states that he feels well. States that he is in good health, overall. He has not had a routine physical in some time.   Past Medical History:  Diagnosis Date  . Anxiety    Phreesia 11/06/2020  . PONV (postoperative nausea and vomiting)   . TMJ arthralgia 2014   + bruxism per his dentist    Past Surgical History:  Procedure Laterality Date  . ANTERIOR CRUCIATE LIGAMENT REPAIR  2002  . HERNIA REPAIR N/A     Phreesia 11/06/2020  . INGUINAL HERNIA REPAIR Left 12/08/2018   Procedure: OPEN LEFT INGUINAL HERNIA REPAIR WITH MESH;  Surgeon: Clovis Riley, MD;  Location: [facility];  Service: General;  Laterality: Left;    Family History  Problem Relation Age of Onset  . Hypertension Father   . Arthritis Other   . Hypertension Other   . Diabetes Maternal Grandmother   . Cancer Neg Hx   . Early death Neg Hx   . Drug abuse Neg Hx   . Heart disease Neg Hx   . Hyperlipidemia Neg Hx   . Kidney disease Neg Hx   . Stroke Neg Hx     Social History   Socioeconomic History  . Marital status: Married    Spouse name: Not on file  . Number of children: Not on file  . Years of education: Not on file  . Highest education level: Not on file  Occupational History  . Not on file  Tobacco Use  . Smoking status: Former Smoker    Packs/day: 0.05    Years: 1.00    Pack years: 0.05  . Smokeless tobacco: Never Used  Vaping Use  . Vaping Use: Never used  Substance and Sexual Activity  . Alcohol use: Yes    Alcohol/week: 5.0 standard drinks    Types: 5 Cans of beer per week  . Drug use: No  . Sexual activity: Yes  Other  Topics Concern  . Not on file  Social History Narrative   ** Merged History Encounter **       Social Determinants of Health   Financial Resource Strain: Not on file  Food Insecurity: Not on file  Transportation Needs: Not on file  Physical Activity: Not on file  Stress: Not on file  Social Connections: Not on file  Intimate Partner Violence: Not on file    ROS Review of Systems  Constitutional: Positive for fatigue.  Neurological: Negative for dizziness and headaches.  Psychiatric/Behavioral: Positive for dysphoric mood and sleep disturbance. The patient is nervous/anxious.     Objective:   Today's Vitals   11/09/20 1041 11/09/20 1132  BP: (!) 148/84 138/85  Pulse: 64 66  Temp: 98.4 F (36.9 C)   SpO2: 100%   Weight: 152 lb 12.8 oz (69.3 kg)    Height: 5\' 11"  (1.803 m)    Body mass index is 21.31 kg/m.    Physical Exam Vitals and nursing note reviewed.  Constitutional:      Appearance: Normal appearance.  HENT:     Head: Normocephalic and atraumatic.     Nose: Nose normal.  Eyes:     Pupils: Pupils are equal, round, and reactive to light.  Neck:     Thyroid: No thyromegaly.  Cardiovascular:     Rate and Rhythm: Normal rate and regular rhythm.     Pulses: Normal pulses.     Heart sounds: Normal heart sounds.  Pulmonary:     Effort: Pulmonary effort is normal.     Breath sounds: Normal breath sounds. No wheezing.  Abdominal:     General: Bowel sounds are normal.     Palpations: Abdomen is soft.  Musculoskeletal:        General: Normal range of motion.     Cervical back: Normal range of motion and neck supple.  Skin:    General: Skin is warm and dry.  Neurological:     General: No focal deficit present.     Mental Status: He is alert and oriented to person, place, and time.  Psychiatric:        Mood and Affect: Mood normal.        Behavior: Behavior normal.        Thought Content: Thought content normal.        Judgment: Judgment normal.     Assessment & Plan:  1. Encounter to establish care Patient seen today to establish primary care provider   2. Fatigue, unspecified type Will check fasting blood work prior to next visit for further evaluation.   3. Generalized anxiety disorder Start sertraline 50mg  daily. Recommend he start with 1/2 tablet for first few days, then increase to whole tablet. He should continue to see therapist as needed and as already scheduled.  - sertraline (ZOLOFT) 50 MG tablet; Take 1 tablet (50 mg total) by mouth daily.  Dispense: 90 tablet; Refill: 0   Problem List Items Addressed This Visit      Other   Generalized anxiety disorder   Relevant Medications   sertraline (ZOLOFT) 50 MG tablet   Encounter to establish care - Primary   Fatigue      Outpatient Encounter  Medications as of 11/09/2020  Medication Sig  . sertraline (ZOLOFT) 50 MG tablet Take 1 tablet (50 mg total) by mouth daily.  Marland Kitchen amoxicillin (AMOXIL) 875 MG tablet Take 1 tablet (875 mg total) by mouth 2 (two) times daily. Take with food  .  ibuprofen (ADVIL,MOTRIN) 200 MG tablet Take 2 tablets (400 mg total) by mouth every 8 (eight) hours as needed. Take EITHER ibuprofen OR naproxen around the clock for the first several days, then can switch to taking as needed.  . triamcinolone cream (KENALOG) 0.1 % Apply 1 application topically 2 (two) times daily.  . [DISCONTINUED] diphenhydrAMINE (SOMINEX) 25 MG tablet Take by mouth.  . [DISCONTINUED] gabapentin (NEURONTIN) 300 MG capsule Take 1 capsule (300 mg total) by mouth 3 (three) times daily. (Patient not taking: Reported on 08/06/2020)   No facility-administered encounter medications on file as of 11/09/2020.   Time spent with the patient was approximately 30 minutes. This time included reviewing progress notes, labs, imaging studies, and discussing plan for follow up.   Follow-up: Return in about 3 months (around 02/09/2021) for Anxiety with FBW few days before.   Ronnell Freshwater, NP

## 2020-11-15 ENCOUNTER — Encounter: Payer: 59 | Admitting: Podiatry

## 2020-11-20 ENCOUNTER — Telehealth: Payer: Self-pay | Admitting: Urology

## 2020-11-20 NOTE — Telephone Encounter (Signed)
DOS: 12/05/20  OPEN TREATMENT METATARSAL 1X ORIF RIGHT -- 28485  PER [employer] WEB SITE FOR CPT CODE 83419 Notification or Prior Authorization is not required for the requested services. Decision ID #:[id number]

## 2020-11-21 ENCOUNTER — Ambulatory Visit
Admission: EM | Admit: 2020-11-21 | Discharge: 2020-11-21 | Disposition: A | Discharge status: Home / Self Care | Payer: 59 | Attending: Family Medicine | Admitting: Family Medicine

## 2020-11-21 ENCOUNTER — Other Ambulatory Visit: Payer: Self-pay

## 2020-11-21 DIAGNOSIS — S61216A Laceration without foreign body of right little finger without damage to nail, initial encounter: Secondary | ICD-10-CM | POA: Diagnosis not present

## 2020-11-21 NOTE — ED Triage Notes (Addendum)
Patient presents to Urgent Care with complaints of cutting his right pinky finger with a knife that occurred today. Up to date on his TDAP vaccine last dose 2018.   Denies pain.

## 2020-11-22 ENCOUNTER — Ambulatory Visit
Admission: EM | Admit: 2020-11-22 | Discharge: 2020-11-22 | Disposition: A | Discharge status: Home / Self Care | Payer: 59 | Attending: Family Medicine | Admitting: Family Medicine

## 2020-11-22 ENCOUNTER — Other Ambulatory Visit: Payer: Self-pay

## 2020-11-22 DIAGNOSIS — S61216D Laceration without foreign body of right little finger without damage to nail, subsequent encounter: Secondary | ICD-10-CM | POA: Diagnosis not present

## 2020-11-22 DIAGNOSIS — M79644 Pain in right finger(s): Secondary | ICD-10-CM

## 2020-11-22 NOTE — Discharge Instructions (Signed)
We have applied tissue adhesive to your wound  Follow up with this office or with primary care for increased swelling, redness, tenderness, warmth, drainage from the area.  Follow up in the ER for red streaking up your hand, high fever, trouble swallowing, trouble breathing, other concerning symptoms.

## 2020-11-22 NOTE — ED Provider Notes (Signed)
[facility]    CSN: [id number] Arrival date & time: 11/22/20  1913      History   Chief Complaint No chief complaint on file.   HPI Evan Lee is a 35 y.o. male.   Reports that he cut his right little finger on a knife at work yesterday. Was seen in this office yesterday for laceration repair and states that wound re opened. Denies OTC treatment. Denies bleeding, drainage, erythema, swelling, heat to the area, fever, other symptoms.  ROS per HPI  The history is provided by the patient.    Past Medical History:  Diagnosis Date  . Anxiety    Phreesia 11/06/2020  . PONV (postoperative nausea and vomiting)   . TMJ arthralgia 2014   + bruxism per his dentist    Patient Active Problem List   Diagnosis Date Noted  . Encounter to establish care 11/12/2020  . Fatigue 11/12/2020  . Inguinal hernia of left side without obstruction or gangrene 10/26/2018  . Acute otitis media 09/03/2018  . Acute maxillary sinusitis 09/03/2018  . Entrapment of right ulnar nerve 07/02/2018  . Numbness 07/02/2018  . Bronchitis 03/01/2018  . Myalgia 10/13/2017  . Fever 10/13/2017  . Generalized anxiety disorder 03/30/2016  . Elevated fasting glucose 03/26/2016  . Environmental and seasonal allergies 02/07/2016  . Routine general medical examination at a health care facility 01/03/2013  . Herpes genitalis in men 01/03/2013  . TMJ arthralgia 01/03/2013  . h/o RLS (restless legs syndrome) 01/03/2013    Past Surgical History:  Procedure Laterality Date  . ANTERIOR CRUCIATE LIGAMENT REPAIR  2002  . HERNIA REPAIR N/A    Phreesia 11/06/2020  . INGUINAL HERNIA REPAIR Left 12/08/2018   Procedure: OPEN LEFT INGUINAL HERNIA REPAIR WITH MESH;  Surgeon: Clovis Riley, MD;  Location: [facility];  Service: General;  Laterality: Left;       Home Medications    Prior to Admission medications   Medication Sig Start Date End Date Taking? Authorizing  Provider  ibuprofen (ADVIL,MOTRIN) 200 MG tablet Take 2 tablets (400 mg total) by mouth every 8 (eight) hours as needed. Take EITHER ibuprofen OR naproxen around the clock for the first several days, then can switch to taking as needed. 12/08/18   Clovis Riley, MD  sertraline (ZOLOFT) 50 MG tablet Take 1 tablet (50 mg total) by mouth daily. 11/09/20   Ronnell Freshwater, NP  gabapentin (NEURONTIN) 300 MG capsule Take 1 capsule (300 mg total) by mouth 3 (three) times daily. Patient not taking: Reported on 08/06/2020 12/08/18 08/06/20  Clovis Riley, MD    Family History Family History  Problem Relation Age of Onset  . Hypertension Father   . Arthritis Other   . Hypertension Other   . Diabetes Maternal Grandmother   . Cancer Neg Hx   . Early death Neg Hx   . Drug abuse Neg Hx   . Heart disease Neg Hx   . Hyperlipidemia Neg Hx   . Kidney disease Neg Hx   . Stroke Neg Hx     Social History Social History   Tobacco Use  . Smoking status: Former Smoker    Packs/day: 0.05    Years: 1.00    Pack years: 0.05  . Smokeless tobacco: Never Used  Vaping Use  . Vaping Use: Never used  Substance Use Topics  . Alcohol use: Yes    Alcohol/week: 5.0 standard drinks    Types: 5 Cans of beer  per week  . Drug use: No     Allergies   Patient has no known allergies.   Review of Systems Review of Systems   Physical Exam Triage Vital Signs ED Triage Vitals [11/22/20 1935]  Enc Vitals Group     BP 133/87     Pulse Rate 72     Resp 18     Temp 98.4 F (36.9 C)     Temp Source Oral     SpO2 98 %     Weight      Height      Head Circumference      Peak Flow      Pain Score      Pain Loc      Pain Edu?      Excl. in [place]?    No data found.  Updated Vital Signs BP 133/87 (BP Location: Left Arm)   Pulse 72   Temp 98.4 F (36.9 C) (Oral)   Resp 18   SpO2 98%      Physical Exam Vitals and nursing note reviewed.  Constitutional:      Appearance: He is well-developed.   HENT:     Head: Normocephalic and atraumatic.  Eyes:     Conjunctiva/sclera: Conjunctivae normal.  Cardiovascular:     Rate and Rhythm: Normal rate and regular rhythm.     Heart sounds: No murmur heard.   Pulmonary:     Effort: No respiratory distress.  Abdominal:     Palpations: Abdomen is soft.     Tenderness: There is no abdominal tenderness.  Musculoskeletal:        General: Normal range of motion.     Cervical back: Neck supple.  Skin:    General: Skin is warm and dry.     Capillary Refill: Capillary refill takes less than 2 seconds.     Findings: Laceration present.          Comments: 0.5cm laceration to palmar surface to right little finger, no bleeding, no drainage noted  Neurological:     General: No focal deficit present.     Mental Status: He is alert and oriented to person, place, and time.  Psychiatric:        Mood and Affect: Mood normal.        Behavior: Behavior normal.        Thought Content: Thought content normal.      UC Treatments / Results  Labs (all labs ordered are listed, but only abnormal results are displayed) Labs Reviewed - No data to display  EKG   Radiology No results found.  Procedures Laceration Repair  Date/Time: 11/23/2020 8:03 AM Performed by: Faustino Congress, NP Authorized by: Faustino Congress, NP   Consent:    Consent obtained:  Verbal   Consent given by:  Patient   Risks discussed:  Infection and pain   Alternatives discussed:  No treatment Universal protocol:    Patient identity confirmed:  Verbally with patient and arm band Anesthesia:    Anesthesia method:  None Laceration details:    Location:  Finger   Finger location:  R small finger   Length (cm):  0.5   Depth (mm):  2 Treatment:    Area cleansed with:  Shur-Clens   Amount of cleaning:  Standard   Debridement:  None   Undermining:  None Skin repair:    Repair method:  Tissue adhesive Approximation:    Approximation:  Close Repair type:  Repair type:  Simple Post-procedure details:    Dressing:  Open (no dressing)   Procedure completion:  Tolerated well, no immediate complications   (including critical care time)  Medications Ordered in UC Medications - No data to display  Initial Impression / Assessment and Plan / UC Course  I have reviewed the triage vital signs and the nursing notes.  Pertinent labs & imaging results that were available during my care of the patient were reviewed by me and considered in my medical decision making (see chart for details).    Right finger pain Laceration of R little finger  Laceration repaired as above Area is not bleeding and occurred over 24 hours ago, not a candidate for sutures Follow up with this office or with primary care for increased swelling, redness, tenderness, warmth, drainage from the area.  Follow up in the ER for red streaking up your hand, high fever, trouble swallowing, trouble breathing, other concerning symptoms.   Final Clinical Impressions(s) / UC Diagnoses   Final diagnoses:  Laceration of right little finger without foreign body without damage to nail, subsequent encounter  Finger pain, right     Discharge Instructions     We have applied tissue adhesive to your wound  Follow up with this office or with primary care for increased swelling, redness, tenderness, warmth, drainage from the area.  Follow up in the ER for red streaking up your hand, high fever, trouble swallowing, trouble breathing, other concerning symptoms.     ED Prescriptions    None     PDMP not reviewed this encounter.   Faustino Congress, NP 11/23/20 [phone]

## 2020-11-23 DIAGNOSIS — S61216D Laceration without foreign body of right little finger without damage to nail, subsequent encounter: Secondary | ICD-10-CM

## 2020-11-25 NOTE — ED Provider Notes (Signed)
[facility]    CSN: [id number] Arrival date & time: 11/21/20  1231      History   Chief Complaint Chief Complaint  Patient presents with  . Hand Injury    Right     HPI Evan MANCUSO is a 35 y.o. male.   Patient here today with laceration to base of right little finger that occurred an hour or so ago while he was cutting something in the kitchen. He states the bleeding is controlled unless he moves the finger and has been applying direct pressure. Denies numbness, tingling, weakness to the finger, foreign body to wound. Has irrigated with water so far but otherwise not applied anything OTC. Last Tdap 2018.      Past Medical History:  Diagnosis Date  . Anxiety    Phreesia 11/06/2020  . PONV (postoperative nausea and vomiting)   . TMJ arthralgia 2014   + bruxism per his dentist    Patient Active Problem List   Diagnosis Date Noted  . Encounter to establish care 11/12/2020  . Fatigue 11/12/2020  . Inguinal hernia of left side without obstruction or gangrene 10/26/2018  . Acute otitis media 09/03/2018  . Acute maxillary sinusitis 09/03/2018  . Entrapment of right ulnar nerve 07/02/2018  . Numbness 07/02/2018  . Bronchitis 03/01/2018  . Myalgia 10/13/2017  . Fever 10/13/2017  . Generalized anxiety disorder 03/30/2016  . Elevated fasting glucose 03/26/2016  . Environmental and seasonal allergies 02/07/2016  . Routine general medical examination at a health care facility 01/03/2013  . Herpes genitalis in men 01/03/2013  . TMJ arthralgia 01/03/2013  . h/o RLS (restless legs syndrome) 01/03/2013    Past Surgical History:  Procedure Laterality Date  . ANTERIOR CRUCIATE LIGAMENT REPAIR  2002  . HERNIA REPAIR N/A    Phreesia 11/06/2020  . INGUINAL HERNIA REPAIR Left 12/08/2018   Procedure: OPEN LEFT INGUINAL HERNIA REPAIR WITH MESH;  Surgeon: Clovis Riley, MD;  Location: [facility];  Service: General;  Laterality: Left;        Home Medications    Prior to Admission medications   Medication Sig Start Date End Date Taking? Authorizing Provider  ibuprofen (ADVIL,MOTRIN) 200 MG tablet Take 2 tablets (400 mg total) by mouth every 8 (eight) hours as needed. Take EITHER ibuprofen OR naproxen around the clock for the first several days, then can switch to taking as needed. 12/08/18   Clovis Riley, MD  sertraline (ZOLOFT) 50 MG tablet Take 1 tablet (50 mg total) by mouth daily. 11/09/20   Ronnell Freshwater, NP  gabapentin (NEURONTIN) 300 MG capsule Take 1 capsule (300 mg total) by mouth 3 (three) times daily. Patient not taking: Reported on 08/06/2020 12/08/18 08/06/20  Clovis Riley, MD    Family History Family History  Problem Relation Age of Onset  . Hypertension Father   . Arthritis Other   . Hypertension Other   . Diabetes Maternal Grandmother   . Cancer Neg Hx   . Early death Neg Hx   . Drug abuse Neg Hx   . Heart disease Neg Hx   . Hyperlipidemia Neg Hx   . Kidney disease Neg Hx   . Stroke Neg Hx     Social History Social History   Tobacco Use  . Smoking status: Former Smoker    Packs/day: 0.05    Years: 1.00    Pack years: 0.05  . Smokeless tobacco: Never Used  Vaping Use  . Vaping Use:  Never used  Substance Use Topics  . Alcohol use: Yes    Alcohol/week: 5.0 standard drinks    Types: 5 Cans of beer per week  . Drug use: No     Allergies   Patient has no known allergies.   Review of Systems Review of Systems PER HPI    Physical Exam Triage Vital Signs ED Triage Vitals  Enc Vitals Group     BP 11/21/20 1255 (!) 157/81     Pulse Rate 11/21/20 1255 82     Resp 11/21/20 1255 16     Temp 11/21/20 1255 98 F (36.7 C)     Temp Source 11/21/20 1255 Temporal     SpO2 11/21/20 1255 97 %     Weight --      Height --      Head Circumference --      Peak Flow --      Pain Score 11/21/20 1256 0     Pain Loc --      Pain Edu? --      Excl. in [place]? --    No data  found.  Updated Vital Signs BP (!) 157/81 (BP Location: Right Arm)   Pulse 82   Temp 98 F (36.7 C) (Temporal)   Resp 16   SpO2 97%   Visual Acuity Right Eye Distance:   Left Eye Distance:   Bilateral Distance:    Right Eye Near:   Left Eye Near:    Bilateral Near:     Physical Exam Vitals and nursing note reviewed.  Constitutional:      Appearance: Normal appearance.  HENT:     Head: Atraumatic.  Eyes:     Extraocular Movements: Extraocular movements intact.     Conjunctiva/sclera: Conjunctivae normal.  Cardiovascular:     Rate and Rhythm: Normal rate and regular rhythm.  Pulmonary:     Effort: Pulmonary effort is normal.     Breath sounds: Normal breath sounds.  Musculoskeletal:        General: Normal range of motion.     Cervical back: Normal range of motion and neck supple.  Skin:    General: Skin is warm and dry.     Comments: 0.5 cm shallow laceration base of right little finger, well approximated clean edges but slightly gape with movement of finger. Bleeding controlled well at this time.   Neurological:     General: No focal deficit present.     Mental Status: He is oriented to person, place, and time.     Comments: Right UE neurovascularly intact  Psychiatric:        Mood and Affect: Mood normal.        Thought Content: Thought content normal.        Judgment: Judgment normal.      UC Treatments / Results  Labs (all labs ordered are listed, but only abnormal results are displayed) Labs Reviewed - No data to display  EKG   Radiology No results found.  Procedures Laceration Repair  Date/Time: 11/21/2020 1:30 PM Performed by: Volney American, PA-C Authorized by: Volney American, PA-C   Consent:    Consent obtained:  Verbal   Consent given by:  Patient   Risks, benefits, and alternatives were discussed: yes     Risks discussed:  Infection, pain, need for additional repair and poor cosmetic result   Alternatives discussed:  suture vs adhesive. Universal protocol:    Procedure explained and questions answered to patient  or proxy's satisfaction: yes     Relevant documents present and verified: yes     Test results available: yes     Site/side marked: yes     Immediately prior to procedure, a time out was called: yes     Patient identity confirmed:  Verbally with patient and arm band Anesthesia:    Anesthesia method:  None Laceration details:    Location:  Finger   Finger location:  R small finger   Length (cm):  0.5   Depth (mm):  2 Exploration:    Limited defect created (wound extended): no     Hemostasis achieved with:  Direct pressure   Wound exploration: wound explored through full range of motion and entire depth of wound visualized     Contaminated: no   Treatment:    Area cleansed with:  Saline   Amount of cleaning:  Standard   Irrigation solution:  Sterile saline   Irrigation method:  Pressure wash and syringe   Visualized foreign bodies/material removed: no     Debridement:  None   Undermining:  None   Scar revision: no     Layers repaired: dermal. Skin repair:    Repair method:  Tissue adhesive and Steri-Strips   Number of Steri-Strips:  3 Approximation:    Approximation:  Close Repair type:    Repair type:  Simple Post-procedure details:    Dressing:  Splint for protection   Procedure completion:  Tolerated well, no immediate complications   (including critical care time)  Medications Ordered in UC Medications - No data to display  Initial Impression / Assessment and Plan / UC Course  I have reviewed the triage vital signs and the nursing notes.  Pertinent labs & imaging results that were available during my care of the patient were reviewed by me and considered in my medical decision making (see chart for details).     Discussed dermabond and steri strips vs 1-2 sutures as area is quite small and well approximated if finger is still, bleeding well controlled. He is  agreeable to adhesives, repair completed as above, splint placed for immobility during healing. Discussed home wound care once steri strips fall off. Discussed warning signs for infection. UTD on TDAP.   Final Clinical Impressions(s) / UC Diagnoses   Final diagnoses:  Laceration of right little finger without foreign body without damage to nail, initial encounter   Discharge Instructions   None    ED Prescriptions    None     PDMP not reviewed this encounter.   Volney American, [place] 11/25/20 [phone]

## 2020-11-29 ENCOUNTER — Encounter: Payer: Self-pay | Admitting: Nurse Practitioner

## 2020-11-29 ENCOUNTER — Encounter: Payer: 59 | Admitting: Podiatry

## 2020-11-29 ENCOUNTER — Ambulatory Visit: Payer: 59 | Admitting: Nurse Practitioner

## 2020-11-29 ENCOUNTER — Other Ambulatory Visit: Payer: Self-pay

## 2020-11-29 VITALS — BP 111/67 | HR 73 | Temp 97.1°F | Ht 71.0 in | Wt 153.7 lb

## 2020-11-29 DIAGNOSIS — R5383 Other fatigue: Secondary | ICD-10-CM | POA: Diagnosis not present

## 2020-11-29 DIAGNOSIS — F411 Generalized anxiety disorder: Secondary | ICD-10-CM

## 2020-11-29 MED ORDER — SERTRALINE HCL 50 MG PO TABS: ORAL_TABLET | ORAL | 0 refills | Status: DC

## 2020-11-29 NOTE — Patient Instructions (Signed)
[email or web address]">  Generalized Anxiety Disorder, Adult Generalized anxiety disorder (GAD) is a mental health condition. Unlike normal worries, anxiety related to GAD is not triggered by a specific event. These worries do not fade or get better with time. GAD interferes with relationships, work, and school. GAD symptoms can vary from mild to severe. People with severe GAD can have intense waves of anxiety with physical symptoms that are similar to panic attacks. What are the causes? The exact cause of GAD is not known, but the following are believed to have an impact:  Differences in natural brain chemicals.  Genes passed down from parents to children.  Differences in the way threats are perceived.  Development during childhood.  Personality. What increases the risk? The following factors may make you more likely to develop this condition:  Being male.  Having a family history of anxiety disorders.  Being very shy.  Experiencing very stressful life events, such as the death of a loved one.  Having a very stressful family environment. What are the signs or symptoms? People with GAD often worry excessively about many things in their lives, such as their health and family. Symptoms may also include:  Mental and emotional symptoms: ? Worrying excessively about natural disasters. ? Fear of being late. ? Difficulty concentrating. ? Fears that others are judging your performance.  Physical symptoms: ? Fatigue. ? Headaches, muscle tension, muscle twitches, trembling, or feeling shaky. ? Feeling like your heart is pounding or beating very fast. ? Feeling out of breath or like you cannot take a deep breath. ? Having trouble falling asleep or staying asleep, or experiencing restlessness. ? Sweating. ? Nausea, diarrhea, or irritable bowel syndrome (IBS).  Behavioral symptoms: ? Experiencing erratic moods or irritability. ? Avoidance of new situations. ? Avoidance of  people. ? Extreme difficulty making decisions. How is this diagnosed? This condition is diagnosed based on your symptoms and medical history. You will also have a physical exam. Your health care provider may perform tests to rule out other possible causes of your symptoms. To be diagnosed with GAD, a person must have anxiety that:  Is out of his or her control.  Affects several different aspects of his or her life, such as work and relationships.  Causes distress that makes him or her unable to take part in normal activities.  Includes at least three symptoms of GAD, such as restlessness, fatigue, trouble concentrating, irritability, muscle tension, or sleep problems. Before your health care provider can confirm a diagnosis of GAD, these symptoms must be present more days than they are not, and they must last for 6 months or longer. How is this treated? This condition may be treated with:  Medicine. Antidepressant medicine is usually prescribed for long-term daily control. Anti-anxiety medicines may be added in severe cases, especially when panic attacks occur.  Talk therapy (psychotherapy). Certain types of talk therapy can be helpful in treating GAD by providing support, education, and guidance. Options include: ? Cognitive behavioral therapy (CBT). People learn coping skills and self-calming techniques to ease their physical symptoms. They learn to identify unrealistic thoughts and behaviors and to replace them with more appropriate thoughts and behaviors. ? Acceptance and commitment therapy (ACT). This treatment teaches people how to be mindful as a way to cope with unwanted thoughts and feelings. ? Biofeedback. This process trains you to manage your body's response (physiological response) through breathing techniques and relaxation methods. You will work with a therapist while machines are used to monitor your physical   symptoms.  Stress management techniques. These include yoga,  meditation, and exercise. A mental health specialist can help determine which treatment is best for you. Some people see improvement with one type of therapy. However, other people require a combination of therapies.   Follow these instructions at home: Lifestyle  Maintain a consistent routine and schedule.  Anticipate stressful situations. Create a plan, and allow extra time to work with your plan.  Practice stress management or self-calming techniques that you have learned from your therapist or your health care provider. General instructions  Take over-the-counter and prescription medicines only as told by your health care provider.  Understand that you are likely to have setbacks. Accept this and be kind to yourself as you persist to take better care of yourself.  Recognize and accept your accomplishments, even if you judge them as small.  Keep all follow-up visits as told by your health care provider. This is important. Contact a health care provider if:  Your symptoms do not get better.  Your symptoms get worse.  You have signs of depression, such as: ? A persistently sad or irritable mood. ? Loss of enjoyment in activities that used to bring you joy. ? Change in weight or eating. ? Changes in sleeping habits. ? Avoiding friends or family members. ? Loss of energy for normal tasks. ? Feelings of guilt or worthlessness. Get help right away if:  You have serious thoughts about hurting yourself or others. If you ever feel like you may hurt yourself or others, or have thoughts about taking your own life, get help right away. Go to your nearest emergency department or:  Call your local emergency services (911 in the [place]  Call a suicide crisis helpline, such as the National Suicide Prevention Lifeline at 1-800-273-8255. This is open 24 hours a day in the [place]  Text the Crisis Text Line at 741741 (in the [place] Summary  Generalized anxiety disorder (GAD) is a mental  health condition that involves worry that is not triggered by a specific event.  People with GAD often worry excessively about many things in their lives, such as their health and family.  GAD may cause symptoms such as restlessness, trouble concentrating, sleep problems, frequent sweating, nausea, diarrhea, headaches, and trembling or muscle twitching.  A mental health specialist can help determine which treatment is best for you. Some people see improvement with one type of therapy. However, other people require a combination of therapies. This information is not intended to replace advice given to you by your health care provider. Make sure you discuss any questions you have with your health care provider. Document Revised: 06/15/2019 Document Reviewed: 06/15/2019 Elsevier Patient Education  2021 Elsevier Inc.  

## 2020-11-29 NOTE — Progress Notes (Signed)
Established Patient Office Visit  Subjective:  Patient ID: Evan Lee, male    DOB: [birth year]  Age: 35 y.o. MRN: [id number]  CC:  Chief Complaint  Patient presents with  . Depression    HPI Evan Lee presents for follow up visit. He was started on sertraline 50mg  at his initial visit with me. He states that he took 1/2 tablet for about one week, then increased dosing t full 50mg  tablet. He denies adverse reactions. States that he has noted some subtle improvements. He states that his thoughts aren't racing like they were and feels like he is sleeping a bit better. He states that the first few days he did have a bit of a headache, however, this resolved on it's own. He has not noted any significant improvement in his mood. He denies feeling sad or irritable, just was expecting a more noticeable improvement in overall mood. He has had some mild sexual side effects since starting this medication.  Of note, he is scheduled to have surgery on his foot next week.   Past Medical History:  Diagnosis Date  . Anxiety    [facility] 11/06/2020  . PONV (postoperative nausea and vomiting)   . TMJ arthralgia 2014   + bruxism per his dentist    Past Surgical History:  Procedure Laterality Date  . ANTERIOR CRUCIATE LIGAMENT REPAIR  2002  . HERNIA REPAIR N/A    [facility] 11/06/2020  . INGUINAL HERNIA REPAIR Left 12/08/2018   Procedure: OPEN LEFT INGUINAL HERNIA REPAIR WITH MESH;  Surgeon: Clovis Riley, MD;  Location: [facility];  Service: General;  Laterality: Left;    Family History  Problem Relation Age of Onset  . Hypertension Father   . Arthritis Other   . Hypertension Other   . Diabetes Maternal Grandmother   . Cancer Neg Hx   . Early death Neg Hx   . Drug abuse Neg Hx   . Heart disease Neg Hx   . Hyperlipidemia Neg Hx   . Kidney disease Neg Hx   . Stroke Neg Hx     Social History   Socioeconomic History  . Marital status: Married     Spouse name: Not on file  . Number of children: Not on file  . Years of education: Not on file  . Highest education level: Not on file  Occupational History  . Not on file  Tobacco Use  . Smoking status: Former Smoker    Packs/day: 0.05    Years: 1.00    Pack years: 0.05  . Smokeless tobacco: Never Used  Vaping Use  . Vaping Use: Never used  Substance and Sexual Activity  . Alcohol use: Yes    Alcohol/week: 5.0 standard drinks    Types: 5 Cans of beer per week  . Drug use: No  . Sexual activity: Yes  Other Topics Concern  . Not on file  Social History Narrative   ** Merged History Encounter **       Social Determinants of Health   Financial Resource Strain: Not on file  Food Insecurity: Not on file  Transportation Needs: Not on file  Physical Activity: Not on file  Stress: Not on file  Social Connections: Not on file  Intimate Partner Violence: Not on file    Outpatient Medications Prior to Visit  Medication Sig Dispense Refill  . sertraline (ZOLOFT) 50 MG tablet Take 1 tablet (50 mg total) by mouth daily. 90 tablet 0  .  ibuprofen (ADVIL,MOTRIN) 200 MG tablet Take 2 tablets (400 mg total) by mouth every 8 (eight) hours as needed. Take EITHER ibuprofen OR naproxen around the clock for the first several days, then can switch to taking as needed. (Patient not taking: Reported on 11/29/2020) 30 tablet 0   No facility-administered medications prior to visit.    No Known Allergies  ROS Review of Systems  Constitutional: Negative for activity change, appetite change, chills, fatigue and fever.  HENT: Negative for congestion and sinus pain.   Respiratory: Negative for cough and wheezing.   Cardiovascular: Negative for chest pain and palpitations.  Gastrointestinal: Negative for constipation, diarrhea, nausea and vomiting.  Endocrine: Negative.   Genitourinary:       Has noted mild sexual side effects since starting this medication.   Musculoskeletal: Positive for  arthralgias. Negative for back pain and myalgias.       Foot pain. Is scheduled to have surgery next week.   Skin: Negative for rash.  Neurological: Negative for dizziness, weakness and headaches.  Psychiatric/Behavioral: Positive for dysphoric mood. The patient is not nervous/anxious.        No big improvement noted in mood, however, feels less racing thoughts. Is able to sleep a little better.   All other systems reviewed and are negative.     Objective:    Physical Exam Vitals and nursing note reviewed.  Constitutional:      Appearance: Normal appearance. He is well-developed.  HENT:     Head: Normocephalic and atraumatic.  Eyes:     Pupils: Pupils are equal, round, and reactive to light.  Cardiovascular:     Rate and Rhythm: Normal rate and regular rhythm.     Pulses: Normal pulses.     Heart sounds: Normal heart sounds.  Pulmonary:     Effort: Pulmonary effort is normal.     Breath sounds: Normal breath sounds.  Abdominal:     Palpations: Abdomen is soft.  Musculoskeletal:        General: Normal range of motion.     Cervical back: Normal range of motion and neck supple.  Skin:    General: Skin is warm and dry.  Neurological:     General: No focal deficit present.     Mental Status: He is alert and oriented to person, place, and time.  Psychiatric:        Mood and Affect: Mood normal.        Behavior: Behavior normal.        Thought Content: Thought content normal.        Judgment: Judgment normal.     Today's Vitals   11/29/20 1104  BP: 111/67  Pulse: 73  Temp: (!) 97.1 F (36.2 C)  SpO2: 99%  Weight: 153 lb 11.2 oz (69.7 kg)  Height: 5\' 11"  (1.803 m)   Body mass index is 21.44 kg/m.    Wt Readings from Last 3 Encounters:  11/29/20 153 lb 11.2 oz (69.7 kg)  11/09/20 152 lb 12.8 oz (69.3 kg)  12/08/18 146 lb 2.6 oz (66.3 kg)     Health Maintenance Due  Topic Date Due  . Hepatitis C Screening  Never done  . COVID-19 Vaccine (1) Never done  .  HIV Screening  Never done    There are no preventive care reminders to display for this patient.  Lab Results  Component Value Date   TSH 2.66 03/31/2016   Lab Results  Component Value Date   WBC 5.8 03/31/2016  HGB 13.9 03/31/2016   HCT 41.2 03/31/2016   MCV 85.1 03/31/2016   PLT 251 03/31/2016   Lab Results  Component Value Date   NA 139 03/31/2016   K 4.1 03/31/2016   CO2 27 03/31/2016   GLUCOSE 85 03/31/2016   BUN 14 03/31/2016   CREATININE 1.00 03/31/2016   BILITOT 0.7 03/31/2016   ALKPHOS 59 03/31/2016   AST 16 03/31/2016   ALT 10 03/31/2016   PROT 6.7 03/31/2016   ALBUMIN 4.5 03/31/2016   CALCIUM 9.4 03/31/2016   GFR 97.23 01/03/2013   Lab Results  Component Value Date   CHOL 147 03/31/2016   Lab Results  Component Value Date   HDL 71 03/31/2016   Lab Results  Component Value Date   LDLCALC 66 03/31/2016   Lab Results  Component Value Date   TRIG 51 03/31/2016   Lab Results  Component Value Date   CHOLHDL 2.1 03/31/2016   Lab Results  Component Value Date   HGBA1C 5.2 03/31/2016      Assessment & Plan:  1. Fatigue, unspecified type Improved some as sleep quality has improved since starting on sertraline. Will monitor.   2. Generalized anxiety disorder Will have patient gradually increase dose of sertraline from 1 to 2 tablets daily. Recommend he take 1.5 tablets for two weeks. Increase to 2 tablets as needed and as tolerated. He should continue regular visits with his therapist as scheduled.  - sertraline (ZOLOFT) 50 MG tablet; Take 1 to 2 tablets po QD  Dispense: 180 tablet; Refill: 0  Problem List Items Addressed This Visit      Other   Generalized anxiety disorder   Relevant Medications   sertraline (ZOLOFT) 50 MG tablet   Fatigue - Primary      Meds ordered this encounter  Medications  . sertraline (ZOLOFT) 50 MG tablet    Sig: Take 1 to 2 tablets po QD    Dispense:  180 tablet    Refill:  0    Patient gradually  increasing from 50 to 75 to 100mg  sertraline as tolerated.    Order Specific Question:   Supervising Provider    Answer:   Beatrice Lecher D [[id number]]   Time spent with the patient was approximately 25 minutes. This time included reviewing progress notes, labs, imaging studies, and discussing plan for follow up.   Follow-up: Return for keep scheduled apponitment for blood work and follow up. thanks. Marland Kitchen    Ronnell Freshwater, NP

## 2020-12-05 ENCOUNTER — Other Ambulatory Visit: Payer: Self-pay | Admitting: Podiatry

## 2020-12-05 ENCOUNTER — Encounter: Payer: Self-pay | Admitting: Podiatry

## 2020-12-05 DIAGNOSIS — S92354K Nondisplaced fracture of fifth metatarsal bone, right foot, subsequent encounter for fracture with nonunion: Secondary | ICD-10-CM | POA: Diagnosis not present

## 2020-12-05 MED ORDER — CEPHALEXIN 500 MG PO CAPS: 500.0000 mg | ORAL_CAPSULE | Freq: Three times a day (TID) | ORAL | 0 refills | Status: DC

## 2020-12-05 MED ORDER — ONDANSETRON HCL 4 MG PO TABS: 4.0000 mg | ORAL_TABLET | Freq: Three times a day (TID) | ORAL | 0 refills | Status: DC | PRN

## 2020-12-05 MED ORDER — HYDROCODONE-ACETAMINOPHEN 10-325 MG PO TABS: 1.0000 | ORAL_TABLET | Freq: Four times a day (QID) | ORAL | 0 refills | Status: AC | PRN

## 2020-12-05 MED ORDER — OXYCODONE-ACETAMINOPHEN 10-325 MG PO TABS: 1.0000 | ORAL_TABLET | Freq: Four times a day (QID) | ORAL | 0 refills | Status: DC | PRN

## 2020-12-05 NOTE — Progress Notes (Signed)
Postop medications sent 

## 2020-12-06 ENCOUNTER — Telehealth: Payer: Self-pay | Admitting: *Deleted

## 2020-12-06 NOTE — Telephone Encounter (Signed)
Called and spoke with the patient and stated that I was calling to see how the patient was doing after having surgery with Dr Jacqualyn Posey and patient has some nausea and was icing and elevating and there was some pain and the nerve block wore off last night and can wiggle the toes and was doing good and the pain medicine was changed and that seem to be better and I stated to call the office if any concerns or questions. Lattie Haw

## 2020-12-11 ENCOUNTER — Ambulatory Visit (INDEPENDENT_AMBULATORY_CARE_PROVIDER_SITE_OTHER): Payer: 59

## 2020-12-11 ENCOUNTER — Encounter: Payer: Self-pay | Admitting: Podiatry

## 2020-12-11 ENCOUNTER — Ambulatory Visit (INDEPENDENT_AMBULATORY_CARE_PROVIDER_SITE_OTHER): Payer: 59 | Admitting: Podiatry

## 2020-12-11 ENCOUNTER — Other Ambulatory Visit: Payer: Self-pay

## 2020-12-11 DIAGNOSIS — S92354D Nondisplaced fracture of fifth metatarsal bone, right foot, subsequent encounter for fracture with routine healing: Secondary | ICD-10-CM

## 2020-12-11 DIAGNOSIS — Z9889 Other specified postprocedural states: Secondary | ICD-10-CM

## 2020-12-11 NOTE — Progress Notes (Signed)
  Subjective:  Patient ID: Evan Lee, male    DOB: 10-16-1985,  MRN: [id number]  Chief Complaint  Patient presents with  . Routine Post Op    PT stated that he is doing well he has no major concerns at this time. He stated that he does have some pain    DOS: 12/05/2020 Procedure: ORIF right fifth metatarsal nonunion  35 y.o. male returns for post-op check.  Doing well having minimal pain has been NWB in the CAM boot with a knee scooter  Review of Systems: Negative except as noted in the HPI. Denies N/V/F/Ch.   Objective:  There were no vitals filed for this visit. There is no height or weight on file to calculate BMI. Constitutional Well developed. Well nourished.  Vascular Foot warm and well perfused. Capillary refill normal to all digits.   Neurologic Normal speech. Oriented to person, place, and time. Epicritic sensation to light touch grossly present bilaterally.  Dermatologic Skin healing well without signs of infection. Skin edges well coapted without signs of infection.  Orthopedic: Tenderness to palpation noted about the surgical site.   Radiographs: Screw fixation intact, good alignment of fracture reduction Assessment:   1. Closed nondisplaced fracture of fifth metatarsal bone of right foot with routine healing, subsequent encounter   2. Post-operative state    Plan:  Patient was evaluated and treated and all questions answered.  S/p foot surgery right -Progressing as expected post-operatively. -XR: As above -WB Status: NWB in CAM boot -Sutures: We will remove next week. -Medications: No refills required -Foot redressed.  Return in about 1 week (around 12/18/2020) for suture removal .

## 2020-12-12 MED ORDER — OXYCODONE-ACETAMINOPHEN 10-325 MG PO TABS: 1.0000 | ORAL_TABLET | Freq: Four times a day (QID) | ORAL | 0 refills | Status: DC | PRN

## 2020-12-18 ENCOUNTER — Other Ambulatory Visit: Payer: Self-pay

## 2020-12-18 ENCOUNTER — Encounter: Payer: Self-pay | Admitting: Podiatry

## 2020-12-18 ENCOUNTER — Ambulatory Visit (INDEPENDENT_AMBULATORY_CARE_PROVIDER_SITE_OTHER): Payer: 59 | Admitting: Podiatry

## 2020-12-18 DIAGNOSIS — Z9889 Other specified postprocedural states: Secondary | ICD-10-CM

## 2020-12-18 DIAGNOSIS — S92354D Nondisplaced fracture of fifth metatarsal bone, right foot, subsequent encounter for fracture with routine healing: Secondary | ICD-10-CM

## 2020-12-18 NOTE — Progress Notes (Signed)
  Subjective:  Patient ID: Evan Lee, male    DOB: Jan 21, 1986,  MRN: [id number]  Chief Complaint  Patient presents with  . Routine Post Op    SUTURE REMOVAL POV #2 DOS 12/05/2020 RT FOOT OPEN REDUCTION INTERNAL FIXATION 5TH METATARSAL FRACTURE, REPAIR AT NON UNION/DR Lee PT    DOS: 12/05/2020 Procedure: ORIF right fifth metatarsal nonunion  35 y.o. male returns for post-op check.  Pain is doing well  Review of Systems: Negative except as noted in the HPI. Denies N/V/F/Ch.   Objective:  There were no vitals filed for this visit. There is no height or weight on file to calculate BMI. Constitutional Well developed. Well nourished.  Vascular Foot warm and well perfused. Capillary refill normal to all digits.   Neurologic Normal speech. Oriented to person, place, and time. Epicritic sensation to light touch grossly present bilaterally.  Dermatologic Skin healing well without signs of infection. Skin edges well coapted without signs of infection.  Orthopedic: Tenderness to palpation noted about the surgical site.   Radiographs: Screw fixation intact, good alignment of fracture reduction Assessment:   1. Closed nondisplaced fracture of fifth metatarsal bone of right foot with routine healing, subsequent encounter   2. Post-operative state    Plan:  Patient was evaluated and treated and all questions answered.  S/p foot surgery right -Sutures removed today, Steri-Strips applied, may begin bathing and applied compression, he should continue to wear this to reduce swelling.  Continue nonweightbearing until he sees Dr. [name]  Return in 16 days (on 01/03/2021) for new x-rays for fracture follow-up with Dr Evan Lee.

## 2020-12-27 ENCOUNTER — Other Ambulatory Visit: Payer: Self-pay | Admitting: Podiatry

## 2020-12-27 MED ORDER — OXYCODONE-ACETAMINOPHEN 5-325 MG PO TABS: 1.0000 | ORAL_TABLET | Freq: Four times a day (QID) | ORAL | 0 refills | Status: DC | PRN

## 2021-01-03 ENCOUNTER — Ambulatory Visit (INDEPENDENT_AMBULATORY_CARE_PROVIDER_SITE_OTHER): Payer: 59

## 2021-01-03 ENCOUNTER — Other Ambulatory Visit: Payer: Self-pay

## 2021-01-03 ENCOUNTER — Ambulatory Visit (INDEPENDENT_AMBULATORY_CARE_PROVIDER_SITE_OTHER): Payer: 59 | Admitting: Podiatry

## 2021-01-03 DIAGNOSIS — Z9889 Other specified postprocedural states: Secondary | ICD-10-CM

## 2021-01-03 DIAGNOSIS — S92354D Nondisplaced fracture of fifth metatarsal bone, right foot, subsequent encounter for fracture with routine healing: Secondary | ICD-10-CM

## 2021-01-03 NOTE — Progress Notes (Signed)
Subjective: Evan Lee is a 35 y.o. is seen today in office s/p right foot fifth metatarsal ORIF preformed on 12/05/2020. They state their pain is improving.  He has been nonweightbearing in the cam boot but he does put some weight to his heel at times. Denies any systemic complaints such as fevers, chills, nausea, vomiting. No calf pain, chest pain, shortness of breath.   Objective: General: No acute distress, AAOx3  DP/PT pulses palpable 2/4, CRT < 3 sec to all digits.  Protective sensation intact. Motor function intact.  RIGHT foot: Incision is well coapted without any evidence of dehiscence.  A scab is present.  There is faint rim of erythema but this is where it looks like the skin is peeled off.  There is more pink in color.  No warmth, drainage or pus or any ascending cellulitis.  No fluctuation crepitation.  There is no malodor. No other areas of tenderness to bilateral lower extremities.  No other open lesions or pre-ulcerative lesions.  No pain with calf compression, swelling, warmth, erythema.   Assessment and Plan:  Status post right foot fifth metatarsal ORIF, doing well with no complications   -Treatment options discussed including all alternatives, risks, and complications -X-rays obtained reviewed.  Increased consolidation of the fracture site.  Hardware intact without any complicating factors otherwise -Recommended small amounts of antibiotic ointment dressing changes daily.  I did cover with a bandage which she has not been doing to avoid any irritation from the boot -Continue in cam boot, nonweightbearing -Ice/elevation -Pain medication as needed. -Monitor for any clinical signs or symptoms of infection and DVT/PE and directed to call the office immediately should any occur or go to the ER. -Follow-up as scheduled or sooner if any problems arise. In the meantime, encouraged to call the office with any questions, concerns, change in symptoms.   *Repeat x-ray  next appointment  Celesta Gentile, DPM

## 2021-01-04 ENCOUNTER — Other Ambulatory Visit: Payer: Self-pay | Admitting: Podiatry

## 2021-01-04 MED ORDER — HYDROCODONE-ACETAMINOPHEN 5-325 MG PO TABS: 1.0000 | ORAL_TABLET | Freq: Four times a day (QID) | ORAL | 0 refills | Status: DC | PRN

## 2021-01-17 ENCOUNTER — Ambulatory Visit (INDEPENDENT_AMBULATORY_CARE_PROVIDER_SITE_OTHER): Payer: 59 | Admitting: Podiatry

## 2021-01-17 ENCOUNTER — Other Ambulatory Visit: Payer: Self-pay

## 2021-01-17 ENCOUNTER — Ambulatory Visit (INDEPENDENT_AMBULATORY_CARE_PROVIDER_SITE_OTHER): Payer: 59

## 2021-01-17 DIAGNOSIS — Z9889 Other specified postprocedural states: Secondary | ICD-10-CM

## 2021-01-17 DIAGNOSIS — S92354D Nondisplaced fracture of fifth metatarsal bone, right foot, subsequent encounter for fracture with routine healing: Secondary | ICD-10-CM

## 2021-01-17 MED ORDER — HYDROCODONE-ACETAMINOPHEN 5-325 MG PO TABS: 1.0000 | ORAL_TABLET | Freq: Four times a day (QID) | ORAL | 0 refills | Status: DC | PRN

## 2021-01-17 MED ORDER — EFINACONAZOLE 10 % EX SOLN: 1.0000 [drp] | Freq: Every day | CUTANEOUS | 11 refills | Status: DC

## 2021-01-17 MED ORDER — DESOXIMETASONE 0.25 % EX CREA: 1.0000 "application " | TOPICAL_CREAM | Freq: Two times a day (BID) | CUTANEOUS | 0 refills | Status: DC

## 2021-01-21 ENCOUNTER — Other Ambulatory Visit: Payer: Self-pay | Admitting: Podiatry

## 2021-01-21 MED ORDER — HYDROCODONE-ACETAMINOPHEN 5-325 MG PO TABS: 1.0000 | ORAL_TABLET | Freq: Four times a day (QID) | ORAL | 0 refills | Status: DC | PRN

## 2021-01-21 NOTE — Progress Notes (Signed)
Subjective: Evan Lee is a 35 y.o. is seen today in office s/p right foot fifth metatarsal ORIF preformed on 12/05/2020.  Overall states he is doing better.  His pain is manageable and still taking some pain medicine at nighttime.  He has put some weight on the foot but on his foot more.  Denies any fevers, chills, nausea, vomiting.  No calf pain, chest pain, shortness of breath.   Objective: General: No acute distress, AAOx3  DP/PT pulses palpable 2/4, CRT < 3 sec to all digits.  Protective sensation intact. Motor function intact.  RIGHT foot: Incision is well coapted without any evidence of dehiscence.  Scab is well formed.  There is minimal edema to the surgical site there is no erythema or warmth.  Minimal discomfort to palpation.  No pain on course the peroneal tendons.  No other areas of discomfort identified.  MMT 5/5. No other open lesions or pre-ulcerative lesions.  No pain with calf compression, swelling, warmth, erythema.   Assessment and Plan:  Status post right foot fifth metatarsal ORIF, doing well with no complications   -Treatment options discussed including all alternatives, risks, and complications -X-rays obtained reviewed.  Increased consolidation of the fracture site mostly on dorsal aspect.  Hardware intact without any complicating factors otherwise -This point discussed with her transition from work weight-bear as tolerated in the cam boot discussed gradual transition.  Continue ice and elevate.  Compression anklet.  *X-ray next appointment  Trula Slade DPM

## 2021-01-29 ENCOUNTER — Encounter: Payer: Self-pay | Admitting: Nurse Practitioner

## 2021-01-31 ENCOUNTER — Ambulatory Visit (INDEPENDENT_AMBULATORY_CARE_PROVIDER_SITE_OTHER): Payer: 59 | Admitting: Podiatry

## 2021-01-31 ENCOUNTER — Other Ambulatory Visit: Payer: Self-pay

## 2021-01-31 ENCOUNTER — Ambulatory Visit (INDEPENDENT_AMBULATORY_CARE_PROVIDER_SITE_OTHER): Payer: 59

## 2021-01-31 DIAGNOSIS — M79671 Pain in right foot: Secondary | ICD-10-CM

## 2021-01-31 DIAGNOSIS — S92354D Nondisplaced fracture of fifth metatarsal bone, right foot, subsequent encounter for fracture with routine healing: Secondary | ICD-10-CM

## 2021-01-31 DIAGNOSIS — Z9889 Other specified postprocedural states: Secondary | ICD-10-CM

## 2021-02-03 NOTE — Progress Notes (Signed)
Subjective: Evan Lee is a 35 y.o. is seen today in office s/p right foot fifth metatarsal ORIF preformed on 12/05/2020.  She is to continue ibuprofen, Tylenol as needed for pain.  He has been back to work full-time wearing the cam boot without significant discomfort.  Most of his discomfort at nighttime. Denies any fevers, chills, nausea, vomiting.  No calf pain, chest pain, shortness of breath.   Objective: General: No acute distress, AAOx3  DP/PT pulses palpable 2/4, CRT < 3 sec to all digits.  Protective sensation intact. Motor function intact.  RIGHT foot: Incision is well coapted without any evidence of dehiscence.  Scar is well formed.  Trace edema.  There is no erythema or warmth.  No significant tenderness palpation of the fifth metatarsal base on the surgical site or any other areas of discomfort. MMT 5/5. No other open lesions or pre-ulcerative lesions.  No pain with calf compression, swelling, warmth, erythema.   Assessment and Plan:  Status post right foot fifth metatarsal ORIF, doing well with no complications   -Treatment options discussed including all alternatives, risks, and complications -X-rays obtained reviewed.  Radiolucent line still evident with delayed union.  Dorsal portion on the lateral view has some mild consolidation but at the plantar aspect. -Given his x-ray findings is not having significant pain.  Continue cam boot for now but discussed with him gradual transition back to regular shoe as tolerated.  Discussed possible physical therapy but he feels he can do this at home on his own. -We will follow back in 4 weeks or sooner if any issues are to arise.  At that point repeat x-rays.  If needed consider bone stimulator.  Trula Slade DPM

## 2021-02-08 ENCOUNTER — Other Ambulatory Visit: Payer: 59

## 2021-02-08 ENCOUNTER — Other Ambulatory Visit: Payer: Self-pay | Admitting: Nurse Practitioner

## 2021-02-08 ENCOUNTER — Other Ambulatory Visit: Payer: Self-pay

## 2021-02-08 DIAGNOSIS — Z0000 Encounter for general adult medical examination without abnormal findings: Secondary | ICD-10-CM | POA: Diagnosis not present

## 2021-02-09 LAB — CBC
Hematocrit: 42.3 % (ref 37.5–51.0)
Hemoglobin: 14.4 g/dL (ref 13.0–17.7)
MCH: 29.1 pg (ref 26.6–33.0)
MCHC: 34 g/dL (ref 31.5–35.7)
MCV: 86 fL (ref 79–97)
Platelets: 264 10*3/uL (ref 150–450)
RBC: 4.94 x10E6/uL (ref 4.14–5.80)
RDW: 12.9 % (ref 11.6–15.4)
WBC: 4.7 10*3/uL (ref 3.4–10.8)

## 2021-02-09 LAB — COMPREHENSIVE METABOLIC PANEL
ALT: 15 IU/L (ref 0–44)
AST: 20 IU/L (ref 0–40)
Albumin/Globulin Ratio: 2.1 (ref 1.2–2.2)
Albumin: 4.9 g/dL (ref 4.0–5.0)
Alkaline Phosphatase: 74 IU/L (ref 44–121)
BUN/Creatinine Ratio: 13 (ref 9–20)
BUN: 13 mg/dL (ref 6–20)
Bilirubin Total: 0.3 mg/dL (ref 0.0–1.2)
CO2: 20 mmol/L (ref 20–29)
Calcium: 9.7 mg/dL (ref 8.7–10.2)
Chloride: 100 mmol/L (ref 96–106)
Creatinine, Ser: 1.02 mg/dL (ref 0.76–1.27)
Globulin, Total: 2.3 g/dL (ref 1.5–4.5)
Glucose: 95 mg/dL (ref 65–99)
Potassium: 5.1 mmol/L (ref 3.5–5.2)
Sodium: 138 mmol/L (ref 134–144)
Total Protein: 7.2 g/dL (ref 6.0–8.5)
eGFR: 98 mL/min/{1.73_m2} (ref >59)

## 2021-02-09 LAB — HEMOGLOBIN A1C
Est. average glucose Bld gHb Est-mCnc: 103 mg/dL
Hgb A1c MFr Bld: 5.2 % (ref 4.8–5.6)

## 2021-02-09 LAB — LIPID PANEL
Chol/HDL Ratio: 2.7 ratio (ref 0.0–5.0)
Cholesterol, Total: 180 mg/dL (ref 100–199)
HDL: 67 mg/dL (ref >39)
LDL Chol Calc (NIH): 104 mg/dL — ABNORMAL HIGH (ref 0–99)
Triglycerides: 44 mg/dL (ref 0–149)
VLDL Cholesterol Cal: 9 mg/dL (ref 5–40)

## 2021-02-09 LAB — TSH: TSH: 1.56 u[IU]/mL (ref 0.450–4.500)

## 2021-02-11 NOTE — Progress Notes (Signed)
Labs good with mildly elevated LDL. Will discuss with patient at visit 6/7

## 2021-02-12 ENCOUNTER — Ambulatory Visit (INDEPENDENT_AMBULATORY_CARE_PROVIDER_SITE_OTHER): Payer: BC Managed Care – PPO | Admitting: Nurse Practitioner

## 2021-02-12 ENCOUNTER — Other Ambulatory Visit: Payer: Self-pay

## 2021-02-12 ENCOUNTER — Encounter: Payer: Self-pay | Admitting: Nurse Practitioner

## 2021-02-12 VITALS — BP 129/72 | HR 71 | Temp 98.3°F | Ht 71.0 in | Wt 151.2 lb

## 2021-02-12 DIAGNOSIS — G2581 Restless legs syndrome: Secondary | ICD-10-CM | POA: Diagnosis not present

## 2021-02-12 DIAGNOSIS — F411 Generalized anxiety disorder: Secondary | ICD-10-CM

## 2021-02-12 MED ORDER — SERTRALINE HCL 100 MG PO TABS: 100.0000 mg | ORAL_TABLET | Freq: Every day | ORAL | 1 refills | Status: DC

## 2021-02-12 MED ORDER — CLONAZEPAM 0.5 MG PO TABS: 0.5000 mg | ORAL_TABLET | Freq: Every evening | ORAL | 1 refills | Status: DC | PRN

## 2021-02-12 NOTE — Progress Notes (Signed)
Established Patient Office Visit  Subjective:  Patient ID: Evan Lee, male    DOB: Jul 02, 1986  Age: 35 y.o. MRN: [id number]  CC:  Chief Complaint  Patient presents with   Follow-up   Anxiety    HPI Evan Lee presents for routine follow up. He recently had surgery to repair the 5th metatarsal of the right foot. States that recovery took longer than expected. Was in post operative boot for several weeks and had to do a gradual taper off of pain medication which was managed per his surgeon. He states that for two weeks after narcotic medications were eliminated from his regimen, he developed severe restless legs. Both legs would burn. States that he had to move them around constantly in order for him to get comfortable. This sensation was keeping him awake at night. He states that this had happened in the past and he did have a long expired prescription for clonazepam. He states that he has taken this a few nights, when the restless legs were very severe. He states that it relieved the constant need to move around his legs and allowed him to sleep a bit better. He states that he is not longer having muscle twitching in his legs. The patient states that he is now completely off of narcotic pain medication. This was verified per his PDMP profile. His overdose risk score is 270 and his last fill of pai nmedicatoin was 01/2021.  At most recent visit with me, zoloft was gradually increased from 50 to 147m daily. He states that he took 771mfor a few weeks then went to the 10034mose. He states that he has really noted an improvement in overall mood. States that he feels more hopeful in general. His racing thoughts have calmed down and he is able to control them much better. He feels less irritable and just happier in general. He is reporting no other negative side effects from zoloft. It is a possibility that two week episode of restless legs was a result of the increased dosage  of sertraline. Will monitor this closely.  The patient did have routine, fasting labs done prior to this visit. His LDL cholesterol was 104 with the remaining lipid panel being normal. His Chol/LDL ratio was 2.7. all other labs were normal.  He denies other concerns or complaints today. He denies chest pain, chest pressure, or shortness of breath. He denies headaches or visual disturbances. He denies abdominal pain, nausea, vomiting, or changes in bowel or bladder habits.   Past Medical History:  Diagnosis Date   Anxiety    Phreesia 11/06/2020   PONV (postoperative nausea and vomiting)    TMJ arthralgia 2014   + bruxism per his dentist    Past Surgical History:  Procedure Laterality Date   ANTERIOR CRUCIATE LIGAMENT REPAIR  2002   HERNIA REPAIR N/A    Phreesia 11/06/2020   INGUINAL HERNIA REPAIR Left 12/08/2018   Procedure: OPEN LEFT INGUINAL HERNIA REPAIR WITH MESH;  Surgeon: ConClovis RileyD;  [facility]  Laterality: Left;    Family History  Problem Relation Age of Onset   Hypertension Father    Arthritis Other    Hypertension Other    Diabetes Maternal Grandmother    Cancer Neg Hx    Early death Neg Hx    Drug abuse Neg Hx    Heart disease Neg Hx    Hyperlipidemia Neg Hx    Kidney disease  Neg Hx    Stroke Neg Hx     Social History   Socioeconomic History   Marital status: Married    Spouse name: Not on file   Number of children: Not on file   Years of education: Not on file   Highest education level: Not on file  Occupational History   Not on file  Tobacco Use   Smoking status: Former    Packs/day: 0.05    Years: 1.00    Pack years: 0.05    Types: Cigarettes   Smokeless tobacco: Never  Vaping Use   Vaping Use: Never used  Substance and Sexual Activity   Alcohol use: Yes    Alcohol/week: 5.0 standard drinks    Types: 5 Cans of beer per week   Drug use: No   Sexual activity: Yes  Other Topics Concern   Not  on file  Social History Narrative   ** Merged History Encounter **       Social Determinants of Health   Financial Resource Strain: Not on file  Food Insecurity: Not on file  Transportation Needs: Not on file  Physical Activity: Not on file  Stress: Not on file  Social Connections: Not on file  Intimate Partner Violence: Not on file    Outpatient Medications Prior to Visit  Medication Sig Dispense Refill   desoximetasone (TOPICORT) 0.25 % cream Apply 1 application topically 2 (two) times daily. 30 g 0   Efinaconazole 10 % SOLN Apply 1 drop topically daily. 4 mL 11   sertraline (ZOLOFT) 50 MG tablet Take 1 to 2 tablets po QD 180 tablet 0   ibuprofen (ADVIL,MOTRIN) 200 MG tablet Take 2 tablets (400 mg total) by mouth every 8 (eight) hours as needed. Take EITHER ibuprofen OR naproxen around the clock for the first several days, then can switch to taking as needed. (Patient not taking: Reported on 11/29/2020) 30 tablet 0   cephALEXin (KEFLEX) 500 MG capsule Take 1 capsule (500 mg total) by mouth 3 (three) times daily. 21 capsule 0   HYDROcodone-acetaminophen (NORCO/VICODIN) 5-325 MG tablet Take 1 tablet by mouth every 6 (six) hours as needed. 15 tablet 0   HYDROcodone-acetaminophen (NORCO/VICODIN) 5-325 MG tablet Take 1 tablet by mouth every 6 (six) hours as needed. 15 tablet 0   ondansetron (ZOFRAN) 4 MG tablet Take 1 tablet (4 mg total) by mouth every 8 (eight) hours as needed for nausea or vomiting. 20 tablet 0   oxyCODONE-acetaminophen (PERCOCET/ROXICET) 5-325 MG tablet Take 1-2 tablets by mouth every 6 (six) hours as needed for severe pain. 15 tablet 0   No facility-administered medications prior to visit.    No Known Allergies  ROS Review of Systems  Constitutional:  Negative for activity change, chills, fatigue and fever.  HENT:  Negative for congestion, postnasal drip, rhinorrhea, sinus pressure and sinus pain.   Eyes: Negative.   Respiratory:  Negative for cough, shortness  of breath and wheezing.   Cardiovascular:  Negative for chest pain and palpitations.  Gastrointestinal:  Negative for constipation, diarrhea, nausea and vomiting.  Endocrine: Negative for cold intolerance, heat intolerance, polydipsia and polyuria.  Musculoskeletal:  Negative for back pain and myalgias.       Had two week episode of leg muscles cramping, especially at night. This has essentially resolved.   Skin:  Negative for rash.  Allergic/Immunologic: Negative.   Neurological:  Negative for dizziness, weakness and headaches.  Psychiatric/Behavioral:  Positive for dysphoric mood and sleep disturbance. The patient  is nervous/anxious.        Mood has improved since increasing zoloft to 1102m daily. Does have some difficulty sleeping which is relieved if able to take clonazepam. Has taken just a few nights and this helped the restless legs and cramping and enabled him to sleep better.      Objective:    Physical Exam Vitals and nursing note reviewed.  Constitutional:      Appearance: Normal appearance. He is well-developed.  HENT:     Head: Normocephalic and atraumatic.  Eyes:     Extraocular Movements: Extraocular movements intact.     Conjunctiva/sclera: Conjunctivae normal.     Pupils: Pupils are equal, round, and reactive to light.  Cardiovascular:     Rate and Rhythm: Normal rate and regular rhythm.     Pulses: Normal pulses.     Heart sounds: Normal heart sounds.  Pulmonary:     Effort: Pulmonary effort is normal.     Breath sounds: Normal breath sounds.  Abdominal:     Palpations: Abdomen is soft.  Musculoskeletal:        General: Normal range of motion.     Cervical back: Normal range of motion and neck supple.  Skin:    General: Skin is warm and dry.     Capillary Refill: Capillary refill takes less than 2 seconds.  Neurological:     General: No focal deficit present.     Mental Status: He is alert and oriented to person, place, and time.  Psychiatric:        Mood  and Affect: Mood normal.        Behavior: Behavior normal.        Thought Content: Thought content normal.        Judgment: Judgment normal.    Today's Vitals   02/12/21 1558  BP: 129/72  Pulse: 71  Temp: 98.3 F (36.8 C)  SpO2: 96%  Weight: 151 lb 3.2 oz (68.6 kg)  Height: _0  (1.803 m)   Body mass index is 21.09 kg/m.   Wt Readings from Last 3 Encounters:  02/12/21 151 lb 3.2 oz (68.6 kg)  11/29/20 153 lb 11.2 oz (69.7 kg)  11/09/20 152 lb 12.8 oz (69.3 kg)     Health Maintenance Due  Topic Date Due   COVID-19 Vaccine (1) Never done   HIV Screening  Never done   Hepatitis C Screening  Never done    There are no preventive care reminders to display for this patient.  Lab Results  Component Value Date   TSH 1.560 02/08/2021   Lab Results  Component Value Date   WBC 4.7 02/08/2021   HGB 14.4 02/08/2021   HCT 42.3 02/08/2021   MCV 86 02/08/2021   PLT 264 02/08/2021   Lab Results  Component Value Date   NA 138 02/08/2021   K 5.1 02/08/2021   CO2 20 02/08/2021   GLUCOSE 95 02/08/2021   BUN 13 02/08/2021   CREATININE 1.02 02/08/2021   BILITOT 0.3 02/08/2021   ALKPHOS 74 02/08/2021   AST 20 02/08/2021   ALT 15 02/08/2021   PROT 7.2 02/08/2021   ALBUMIN 4.9 02/08/2021   CALCIUM 9.7 02/08/2021   EGFR 98 02/08/2021   GFR 97.23 01/03/2013   Lab Results  Component Value Date   CHOL 180 02/08/2021   Lab Results  Component Value Date   HDL 67 02/08/2021   Lab Results  Component Value Date   LDLCALC 104 (H) 02/08/2021  Lab Results  Component Value Date   TRIG 44 02/08/2021   Lab Results  Component Value Date   CHOLHDL 2.7 02/08/2021   Lab Results  Component Value Date   HGBA1C 5.2 02/08/2021      Assessment & Plan:  1. Restless legs syndrome Will do trial of clonazepam 0.42m to take as needed for RLS and/or anxiety. A prescription for #30 tablets with one refill was sent to his pharmacy. Reviewed risks and possible side effects  associated with taking benzodiazepines. Advised him not to mix this medication with any other controlled medications, as dizziness or drowsiness may ensue. Advised him not to drive or woark after taking this medication as due to risk of negative side effects. He voiced understanding and agreement with the instructions.   2. Generalized anxiety disorder Improved. Continue sertraline at 1047mdaily. May take clonazepam 0.33m27mt bedtime as needed for acte anxiety.  - sertraline (ZOLOFT) 100 MG tablet; Take 1 tablet (100 mg total) by mouth daily.  Dispense: 90 tablet; Refill: 1 - clonazePAM (KLONOPIN) 0.5 MG tablet; Take 1 tablet (0.5 mg total) by mouth at bedtime as needed for anxiety.  Dispense: 30 tablet; Refill: 1   Problem List Items Addressed This Visit       Other   Generalized anxiety disorder   Relevant Medications   sertraline (ZOLOFT) 100 MG tablet   clonazePAM (KLONOPIN) 0.5 MG tablet   Restless legs syndrome - Primary    Meds ordered this encounter  Medications   sertraline (ZOLOFT) 100 MG tablet    Sig: Take 1 tablet (100 mg total) by mouth daily.    Dispense:  90 tablet    Refill:  1    Please note increased dose    Order Specific Question:   Supervising Provider    Answer:   METBeatrice Lecher[[id number]]   clonazePAM (KLONOPIN) 0.5 MG tablet    Sig: Take 1 tablet (0.5 mg total) by mouth at bedtime as needed for anxiety.    Dispense:  30 tablet    Refill:  1    Order Specific Question:   Supervising Provider    Answer:   METBeatrice Lecher[[id number]]    Follow-up: Return in about 3 months (around 05/15/2021) for mood.    HeaRonnell FreshwaterP

## 2021-02-12 NOTE — Patient Instructions (Signed)

## 2021-02-24 DIAGNOSIS — G2581 Restless legs syndrome: Secondary | ICD-10-CM | POA: Insufficient documentation

## 2021-03-12 ENCOUNTER — Other Ambulatory Visit: Payer: Self-pay

## 2021-03-12 ENCOUNTER — Ambulatory Visit (INDEPENDENT_AMBULATORY_CARE_PROVIDER_SITE_OTHER): Payer: BC Managed Care – PPO

## 2021-03-12 ENCOUNTER — Encounter: Payer: Self-pay | Admitting: Podiatry

## 2021-03-12 ENCOUNTER — Ambulatory Visit (INDEPENDENT_AMBULATORY_CARE_PROVIDER_SITE_OTHER): Payer: BC Managed Care – PPO | Admitting: Podiatry

## 2021-03-12 DIAGNOSIS — S92354D Nondisplaced fracture of fifth metatarsal bone, right foot, subsequent encounter for fracture with routine healing: Secondary | ICD-10-CM

## 2021-03-12 DIAGNOSIS — S92354K Nondisplaced fracture of fifth metatarsal bone, right foot, subsequent encounter for fracture with nonunion: Secondary | ICD-10-CM

## 2021-03-13 NOTE — Progress Notes (Signed)
Subjective: Evan Lee is a 35 y.o. is seen today in office s/p right foot fifth metatarsal ORIF preformed on 12/05/2020.  States he is back to my regular shoe at work he has not been doing any physical exercise.  Some occasional discomfort.  The pain he is having prior to surgery has resolved and the pain is different now.  States that he hits the foot a certain way he will get discomfort but overall he does feel it is improving.  Objective: General: No acute distress, AAOx3  DP/PT pulses palpable 2/4, CRT < 3 sec to all digits.  Protective sensation intact. Motor function intact.  RIGHT foot: Incision is well coapted without any evidence of dehiscence.  Scar is well formed.  There is trace edema there is no erythema or warmth.  There is no significant tenderness palpation of the surgical site on the fifth metatarsal base.  No other areas of discomfort. No other open lesions or pre-ulcerative lesions.  No pain with calf compression, swelling, warmth, erythema.   Assessment and Plan:  Status post right foot fifth metatarsal ORIF, doing well with no complications   -Treatment options discussed including all alternatives, risks, and complications -X-rays obtained reviewed.  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  Hardware intact. -At this point we will order a bone stimulator given the nonunion -Continue stiffer, supportive shoe gear. -Continue ice elevate continue offloading as tolerated work.  If any increase in pain return back to the surgical boot.  Trula Slade DPM

## 2021-03-29 DIAGNOSIS — S92354K Nondisplaced fracture of fifth metatarsal bone, right foot, subsequent encounter for fracture with nonunion: Secondary | ICD-10-CM | POA: Diagnosis not present

## 2021-04-01 ENCOUNTER — Encounter: Payer: Self-pay | Admitting: Nurse Practitioner

## 2021-04-23 ENCOUNTER — Ambulatory Visit: Payer: 59 | Admitting: Podiatry

## 2021-05-07 ENCOUNTER — Ambulatory Visit: Payer: BC Managed Care – PPO | Admitting: Podiatry

## 2021-05-17 DIAGNOSIS — Z3009 Encounter for other general counseling and advice on contraception: Secondary | ICD-10-CM | POA: Diagnosis not present

## 2021-05-21 ENCOUNTER — Ambulatory Visit: Payer: BC Managed Care – PPO | Admitting: Nurse Practitioner

## 2021-05-23 ENCOUNTER — Ambulatory Visit: Payer: BC Managed Care – PPO | Admitting: Nurse Practitioner

## 2021-07-01 ENCOUNTER — Other Ambulatory Visit: Payer: Self-pay

## 2021-07-01 DIAGNOSIS — F411 Generalized anxiety disorder: Secondary | ICD-10-CM

## 2021-07-03 ENCOUNTER — Encounter: Payer: Self-pay | Admitting: Nurse Practitioner

## 2021-07-03 ENCOUNTER — Other Ambulatory Visit: Payer: Self-pay

## 2021-07-03 ENCOUNTER — Ambulatory Visit (INDEPENDENT_AMBULATORY_CARE_PROVIDER_SITE_OTHER): Payer: BC Managed Care – PPO | Admitting: Nurse Practitioner

## 2021-07-03 VITALS — BP 116/67 | HR 69 | Temp 98.0°F | Ht 71.0 in | Wt 158.7 lb

## 2021-07-03 DIAGNOSIS — Z23 Encounter for immunization: Secondary | ICD-10-CM | POA: Diagnosis not present

## 2021-07-03 DIAGNOSIS — K582 Mixed irritable bowel syndrome: Secondary | ICD-10-CM | POA: Diagnosis not present

## 2021-07-03 DIAGNOSIS — J309 Allergic rhinitis, unspecified: Secondary | ICD-10-CM | POA: Diagnosis not present

## 2021-07-03 DIAGNOSIS — L299 Pruritus, unspecified: Secondary | ICD-10-CM | POA: Diagnosis not present

## 2021-07-03 DIAGNOSIS — F411 Generalized anxiety disorder: Secondary | ICD-10-CM | POA: Diagnosis not present

## 2021-07-03 MED ORDER — SERTRALINE HCL 50 MG PO TABS: 50.0000 mg | ORAL_TABLET | Freq: Every day | ORAL | 1 refills | Status: DC

## 2021-07-03 NOTE — Progress Notes (Signed)
Established Patient Office Visit  Subjective:  Patient ID: Evan Lee, male    DOB: 1986/03/01  Age: 35 y.o. MRN: [id number]  CC:  Chief Complaint  Patient presents with   Medication Refill    HPI Evan Lee presents for routine follow up. He has been doing well in general. Has weaned himself down to sertraline $RemoveBefor'50mg'THDCsaSKoIKh$  daily. Feels as though this has been a good adjustment for him. He does report some increased fatigue and does have decreased libido since being on sertraline. He does need a refills for his sertraline today.  He state that he has long history of IBS. He also has increased gas, especially with certain foods, high in wheat content or gluten. He wil take tums or zantac when needed for heartburn related issues. He does have intermittent pruritic issues with his skin and more frequent headaches. He states that over the weekend, he was told that he states that he gets frequent sinus issues. When outside, he does get red dots' itchy rashes on the forearms. Unsure what, if anything, he might be allergic to.  He denies other concerns or complaints at this time. He denies chest pain, chest pressure, or shortness of breath. He denies headaches or visual disturbances. He denies abdominal pain, nausea, vomiting, or changes in bowel or bladder habits.    Past Medical History:  Diagnosis Date   Anxiety    [facility]reesia 11/06/2020   PONV (postoperative nausea and vomiting)    TMJ arthralgia 2014   + bruxism per his dentist    Past Surgical History:  Procedure Laterality Date   ANTERIOR CRUCIATE LIGAMENT REPAIR  2002   HERNIA REPAIR N/A    Phreesia 11/06/2020   INGUINAL HERNIA REPAIR Left 12/08/2018   Procedure: OPEN LEFT INGUINAL HERNIA REPAIR WITH MESH;  Surgeon: Clovis Riley, MD;  Location: [facility];  Service: General;  Laterality: Left;    Family History  Problem Relation Age of Onset   Hypertension Father    Arthritis Other     Hypertension Other    Diabetes Maternal Grandmother    Cancer Neg Hx    Early death Neg Hx    Drug abuse Neg Hx    Heart disease Neg Hx    Hyperlipidemia Neg Hx    Kidney disease Neg Hx    Stroke Neg Hx     Social History   Socioeconomic History   Marital status: Married    Spouse name: Not on file   Number of children: Not on file   Years of education: Not on file   Highest education level: Not on file  Occupational History   Not on file  Tobacco Use   Smoking status: Former    Packs/day: 0.05    Years: 1.00    Pack years: 0.05    Types: Cigarettes   Smokeless tobacco: Never  Vaping Use   Vaping Use: Never used  Substance and Sexual Activity   Alcohol use: Yes    Alcohol/week: 5.0 standard drinks    Types: 5 Cans of beer per week   Drug use: No   Sexual activity: Yes  Other Topics Concern   Not on file  Social History Narrative   ** Merged History Encounter **       Social Determinants of Health   Financial Resource Strain: Not on file  Food Insecurity: Not on file  Transportation Needs: Not on file  Physical Activity: Not on file  Stress: Not  on file  Social Connections: Not on file  Intimate Partner Violence: Not on file    Outpatient Medications Prior to Visit  Medication Sig Dispense Refill   clonazePAM (KLONOPIN) 0.5 MG tablet Take 1 tablet (0.5 mg total) by mouth at bedtime as needed for anxiety. 30 tablet 1   desoximetasone (TOPICORT) 0.25 % cream Apply 1 application topically 2 (two) times daily. 30 g 0   Efinaconazole 10 % SOLN Apply 1 drop topically daily. 4 mL 11   sertraline (ZOLOFT) 100 MG tablet Take 1 tablet (100 mg total) by mouth daily. 90 tablet 1   No facility-administered medications prior to visit.    No Known Allergies  ROS Review of Systems  Constitutional:  Negative for activity change, chills, fatigue and fever.  HENT:  Negative for congestion, postnasal drip, rhinorrhea, sinus pressure, sinus pain, sneezing and sore  throat.   Eyes: Negative.   Respiratory:  Negative for cough, shortness of breath and wheezing.   Cardiovascular:  Negative for chest pain and palpitations.  Gastrointestinal:  Positive for diarrhea. Negative for constipation, nausea and vomiting.       Intermittent abdominal cramping with increased gas.   Endocrine: Negative for cold intolerance, heat intolerance, polydipsia and polyuria.  Genitourinary:  Negative for dysuria, frequency and urgency.  Musculoskeletal:  Negative for back pain and myalgias.  Skin:  Negative for rash.       Intermittent pruritic rashes   Allergic/Immunologic: Positive for food allergies. Negative for environmental allergies.  Neurological:  Negative for dizziness, weakness and headaches.  Psychiatric/Behavioral:  The patient is nervous/anxious.        Improved mood. Has reduced his sertraline to $RemoveBefor'50mg'jmTaqrwmllbm$  daily. Feels like this dos eis fitting hm best.      Objective:    Physical Exam Vitals and nursing note reviewed.  Constitutional:      Appearance: Normal appearance. He is well-developed.  HENT:     Head: Normocephalic and atraumatic.     Nose: Nose normal.     Mouth/Throat:     Mouth: Mucous membranes are moist.  Eyes:     Extraocular Movements: Extraocular movements intact.     Conjunctiva/sclera: Conjunctivae normal.     Pupils: Pupils are equal, round, and reactive to light.  Cardiovascular:     Rate and Rhythm: Normal rate and regular rhythm.     Pulses: Normal pulses.     Heart sounds: Normal heart sounds.  Pulmonary:     Effort: Pulmonary effort is normal.     Breath sounds: Normal breath sounds.  Abdominal:     Palpations: Abdomen is soft.  Musculoskeletal:        General: Normal range of motion.     Cervical back: Normal range of motion and neck supple.  Lymphadenopathy:     Cervical: No cervical adenopathy.  Skin:    General: Skin is warm and dry.     Capillary Refill: Capillary refill takes less than 2 seconds.  Neurological:      General: No focal deficit present.     Mental Status: He is alert and oriented to person, place, and time.  Psychiatric:        Mood and Affect: Mood normal.        Behavior: Behavior normal.        Thought Content: Thought content normal.        Judgment: Judgment normal.    Today's Vitals   07/03/21 1527  BP: 116/67  Pulse: 69  Temp:  98 F (36.7 C)  SpO2: 97%  Weight: 158 lb 11.2 oz (72 kg)  Height: $Remove'5\' 11"'yxcjjEi$  (1.803 m)   Body mass index is 22.13 kg/m.   Wt Readings from Last 3 Encounters:  07/03/21 158 lb 11.2 oz (72 kg)  02/12/21 151 lb 3.2 oz (68.6 kg)  11/29/20 153 lb 11.2 oz (69.7 kg)     Health Maintenance Due  Topic Date Due   COVID-19 Vaccine (1) Never done    There are no preventive care reminders to display for this patient.  Lab Results  Component Value Date   TSH 1.560 02/08/2021   Lab Results  Component Value Date   WBC 4.7 02/08/2021   HGB 14.4 02/08/2021   HCT 42.3 02/08/2021   MCV 86 02/08/2021   PLT 264 02/08/2021   Lab Results  Component Value Date   NA 138 02/08/2021   K 5.1 02/08/2021   CO2 20 02/08/2021   GLUCOSE 95 02/08/2021   BUN 13 02/08/2021   CREATININE 1.02 02/08/2021   BILITOT 0.3 02/08/2021   ALKPHOS 74 02/08/2021   AST 20 02/08/2021   ALT 15 02/08/2021   PROT 7.2 02/08/2021   ALBUMIN 4.9 02/08/2021   CALCIUM 9.7 02/08/2021   EGFR 98 02/08/2021   GFR 97.23 01/03/2013   Lab Results  Component Value Date   CHOL 180 02/08/2021   Lab Results  Component Value Date   HDL 67 02/08/2021   Lab Results  Component Value Date   LDLCALC 104 (H) 02/08/2021   Lab Results  Component Value Date   TRIG 44 02/08/2021   Lab Results  Component Value Date   CHOLHDL 2.7 02/08/2021   Lab Results  Component Value Date   HGBA1C 5.2 02/08/2021      Assessment & Plan:  1. Irritable bowel syndrome with both constipation and diarrhea Unclear etiology. Family history of celiac disease. Will get labs to evaluate for celiac  disease, gluten sensitivity, and other food intolerances. Will discuss results with patient when results are available.  - Celiac Disease Antibody Screen; Future - Gluten Sensitivity Screen; Future - Allergen Profile, Basic Food; Future  2. Pruritic condition Check labs for food and environmental allergies. Will discuss results with patient when results are available.  - Celiac Disease Antibody Screen; Future - Gluten Sensitivity Screen; Future - Allergen Profile, Basic Food; Future - Allergens w/Total IgE Area 2; Future  3. Chronic allergic rhinitis Check labs for food and environmental allergies. Will discuss results with patient when results are available.  - Allergens w/Total IgE Area 2; Future  4. Generalized anxiety disorder Continue sertraline at $RemoveBefor'50mg'kgVDZyTzxiMJ$  daily. New rx sent to his pharmacy today  - sertraline (ZOLOFT) 50 MG tablet; Take 1 tablet (50 mg total) by mouth daily.  Dispense: 90 tablet; Refill: 1  5. Need for influenza vaccination Flu vaccine administered today - Flu Vaccine QUAD 6+ mos PF IM (Fluarix Quad PF)   Problem List Items Addressed This Visit       Respiratory   Chronic allergic rhinitis   Relevant Orders   Allergens w/Total IgE Area 2     Digestive   Irritable bowel syndrome with both constipation and diarrhea - Primary   Relevant Orders   Celiac Disease Antibody Screen   Gluten Sensitivity Screen   Allergen Profile, Basic Food     Musculoskeletal and Integument   Pruritic condition   Relevant Orders   Celiac Disease Antibody Screen   Gluten Sensitivity Screen   Allergen Profile,  Basic Food   Allergens w/Total IgE Area 2     Other   Generalized anxiety disorder   Relevant Medications   sertraline (ZOLOFT) 50 MG tablet   Other Visit Diagnoses     Need for influenza vaccination       Relevant Orders   Flu Vaccine QUAD 6+ mos PF IM (Fluarix Quad PF) (Completed)       Meds ordered this encounter  Medications   sertraline (ZOLOFT) 50 MG  tablet    Sig: Take 1 tablet (50 mg total) by mouth daily.    Dispense:  90 tablet    Refill:  1    Please note increased dose    Order Specific Question:   Supervising Provider    Answer:   Beatrice Lecher D [[id number]]     Follow-up: Return in about 6 months (around 01/01/2022) for health maintenance exam -patient to [facility] beke appt for tomorrow labs - see below.    Ronnell Freshwater, NP

## 2021-07-04 ENCOUNTER — Other Ambulatory Visit: Payer: BC Managed Care – PPO

## 2021-07-04 DIAGNOSIS — J309 Allergic rhinitis, unspecified: Secondary | ICD-10-CM

## 2021-07-04 DIAGNOSIS — L299 Pruritus, unspecified: Secondary | ICD-10-CM

## 2021-07-04 DIAGNOSIS — J3089 Other allergic rhinitis: Secondary | ICD-10-CM | POA: Insufficient documentation

## 2021-07-04 DIAGNOSIS — K582 Mixed irritable bowel syndrome: Secondary | ICD-10-CM

## 2021-07-08 NOTE — Progress Notes (Signed)
Gluten sensitive, not necessarily celiac disease. Waiting in all labs to return .

## 2021-07-11 LAB — ALLERGENS W/TOTAL IGE AREA 2
Alternaria Alternata IgE: <0.1 kU/L
Aspergillus Fumigatus IgE: <0.1 kU/L
Bermuda Grass IgE: 0.58 kU/L — AB
Cat Dander IgE: <0.1 kU/L
Cedar, Mountain IgE: 0.47 kU/L — AB
Cladosporium Herbarum IgE: <0.1 kU/L
Cockroach, German IgE: 0.43 kU/L — AB
Common Silver Birch IgE: 0.51 kU/L — AB
Cottonwood IgE: 0.55 kU/L — AB
D Farinae IgE: 0.12 kU/L — AB
D Pteronyssinus IgE: <0.1 kU/L
Dog Dander IgE: <0.1 kU/L
Elm, American IgE: 0.53 kU/L — AB
IgE (Immunoglobulin E), Serum: 29 IU/mL (ref 6–495)
Johnson Grass IgE: 0.55 kU/L — AB
Maple/Box Elder IgE: 0.53 kU/L — AB
Mouse Urine IgE: <0.1 kU/L
Oak, White IgE: 0.59 kU/L — AB
Pecan, Hickory IgE: 0.56 kU/L — AB
Penicillium Chrysogen IgE: <0.1 kU/L
Pigweed, Rough IgE: 0.5 kU/L — AB
Ragweed, Short IgE: 0.55 kU/L — AB
Sheep Sorrel IgE Qn: 0.52 kU/L — AB
Timothy Grass IgE: 0.48 kU/L — AB
White Mulberry IgE: 0.4 kU/L — AB

## 2021-07-11 LAB — CELIAC DISEASE ANTIBODY SCREEN
Antigliadin Abs, IgA: 8 units (ref 0–19)
IgA/Immunoglobulin A, Serum: 120 mg/dL (ref 90–386)
Transglutaminase IgA: <2 U/mL (ref 0–3)

## 2021-07-11 LAB — ALLERGEN PROFILE, BASIC FOOD
Allergen Corn, IgE: 0.44 kU/L — AB
Beef IgE: 0.18 kU/L — AB
Chocolate/Cacao IgE: <0.1 kU/L
Egg, Whole IgE: <0.1 kU/L
Food Mix (Seafoods) IgE: NEGATIVE
Milk IgE: <0.1 kU/L
Peanut IgE: 0.56 kU/L — AB
Pork IgE: <0.1 kU/L
Soybean IgE: 0.44 kU/L — AB
Wheat IgE: 0.45 kU/L — AB

## 2021-07-11 LAB — GLUTEN SENSITIVITY SCREEN: tTG/DGP Screen: POSITIVE — AB

## 2021-07-11 LAB — NOTE:

## 2021-07-14 ENCOUNTER — Encounter: Payer: Self-pay | Admitting: Nurse Practitioner

## 2021-07-14 ENCOUNTER — Other Ambulatory Visit: Payer: Self-pay | Admitting: Nurse Practitioner

## 2021-07-14 NOTE — Progress Notes (Signed)
Multiple mild to moderate environmental and food related allergies. New letter sent to patient via mychart. Consider referral to allergist/immunologist.

## 2021-07-15 ENCOUNTER — Other Ambulatory Visit: Payer: Self-pay | Admitting: Nurse Practitioner

## 2021-07-15 DIAGNOSIS — J309 Allergic rhinitis, unspecified: Secondary | ICD-10-CM

## 2021-07-15 DIAGNOSIS — Z91018 Allergy to other foods: Secondary | ICD-10-CM

## 2021-07-15 DIAGNOSIS — Z9109 Other allergy status, other than to drugs and biological substances: Secondary | ICD-10-CM

## 2021-07-29 ENCOUNTER — Encounter: Payer: Self-pay | Admitting: Nurse Practitioner

## 2021-07-31 ENCOUNTER — Other Ambulatory Visit: Payer: Self-pay

## 2021-07-31 ENCOUNTER — Ambulatory Visit
Admission: EM | Admit: 2021-07-31 | Discharge: 2021-07-31 | Disposition: A | Discharge status: Home / Self Care | Payer: BC Managed Care – PPO | Attending: Internal Medicine | Admitting: Internal Medicine

## 2021-07-31 ENCOUNTER — Encounter: Payer: Self-pay | Admitting: Emergency Medicine

## 2021-07-31 DIAGNOSIS — J029 Acute pharyngitis, unspecified: Secondary | ICD-10-CM | POA: Insufficient documentation

## 2021-07-31 DIAGNOSIS — J069 Acute upper respiratory infection, unspecified: Secondary | ICD-10-CM | POA: Diagnosis not present

## 2021-07-31 LAB — POCT RAPID STREP A (OFFICE): Rapid Strep A Screen: NEGATIVE

## 2021-07-31 LAB — POCT INFLUENZA A/B
Influenza A, POC: NEGATIVE
Influenza B, POC: NEGATIVE

## 2021-07-31 NOTE — Discharge Instructions (Signed)
You likely having a viral upper respiratory infection. We recommended symptom control. I expect your symptoms to start improving in the next 1-2 weeks.   1. Take a daily allergy pill/anti-histamine like Zyrtec, Claritin, or Store brand consistently for 2 weeks  2. For congestion you may try an oral decongestant like Mucinex or sudafed. You may also try intranasal flonase nasal spray or saline irrigations (neti pot, sinus cleanse)  3. For your sore throat you may try cepacol lozenges, salt water gargles, throat spray. Treatment of congestion may also help your sore throat.  4. For cough you may try Robitussen, Mucinex DM  5. Take Tylenol or Ibuprofen to help with pain/inflammation  6. Stay hydrated, drink plenty of fluids to keep throat coated and less irritated  Honey Tea For cough/sore throat try using a honey-based tea. Use 3 teaspoons of honey with juice squeezed from half lemon. Place shaved pieces of ginger into 1/2-1 cup of water and warm over stove top. Then mix the ingredients and repeat every 4 hours as needed.   Your rapid strep and flu tests were negative.

## 2021-07-31 NOTE — ED Provider Notes (Signed)
[facility]    CSN: [id number] Arrival date & time: 07/31/21  4097      History   Chief Complaint Chief Complaint  Patient presents with   Nasal Congestion   Sore Throat    HPI Evan Lee is a 35 y.o. male.   Patient presents with nasal congestion, sore throat, runny nose, mild cough.  Cough is mildly productive per patient.  Denies any known sick contacts.  Patient has not yet taken any medications to help alleviate symptoms.  Denies any fevers.  Denies chest pain, shortness of breath, nausea, vomiting, diarrhea, abdominal pain.  Patient requesting [id number], flu testing, strep testing.  Patient had telephone call with PCP requesting pinkeye prescription given that children have pinkeye at home, patient denies that he has any current eye irritation symptoms. He did not get prescription.     Sore Throat   Past Medical History:  Diagnosis Date   Anxiety    [employer] 11/06/2020   PONV (postoperative nausea and vomiting)    TMJ arthralgia 2014   + bruxism per his dentist    Patient Active Problem List   Diagnosis Date Noted   Irritable bowel syndrome with both constipation and diarrhea 07/04/2021   Pruritic condition 07/04/2021   Chronic allergic rhinitis 07/04/2021   Restless legs syndrome 02/24/2021   Encounter to establish care 11/12/2020   Fatigue 11/12/2020   Inguinal hernia of left side without obstruction or gangrene 10/26/2018   Acute otitis media 09/03/2018   Acute maxillary sinusitis 09/03/2018   Entrapment of right ulnar nerve 07/02/2018   Numbness 07/02/2018   Bronchitis 03/01/2018   Myalgia 10/13/2017   Fever 10/13/2017   Generalized anxiety disorder 03/30/2016   Elevated fasting glucose 03/26/2016   Environmental and seasonal allergies 02/07/2016   Routine general medical examination at a health care facility 01/03/2013   Herpes genitalis in men 01/03/2013   TMJ arthralgia 01/03/2013   h/o RLS (restless legs syndrome)  01/03/2013    Past Surgical History:  Procedure Laterality Date   ANTERIOR CRUCIATE LIGAMENT REPAIR  2002   HERNIA REPAIR N/A    [employer] 11/06/2020   INGUINAL HERNIA REPAIR Left 12/08/2018   Procedure: OPEN LEFT INGUINAL HERNIA REPAIR WITH MESH;  Surgeon: Clovis Riley, MD;  Location: [facility];  Service: General;  Laterality: Left;       Home Medications    Prior to Admission medications   Medication Sig Start Date End Date Taking? Authorizing Provider  clonazePAM (KLONOPIN) 0.5 MG tablet Take 1 tablet (0.5 mg total) by mouth at bedtime as needed for anxiety. 02/12/21   Ronnell Freshwater, NP  desoximetasone (TOPICORT) 0.25 % cream Apply 1 application topically 2 (two) times daily. 01/17/21   Trula Slade, DPM  Efinaconazole 10 % SOLN Apply 1 drop topically daily. 01/17/21   Trula Slade, DPM  sertraline (ZOLOFT) 50 MG tablet Take 1 tablet (50 mg total) by mouth daily. 07/03/21   Ronnell Freshwater, NP  gabapentin (NEURONTIN) 300 MG capsule Take 1 capsule (300 mg total) by mouth 3 (three) times daily. Patient not taking: Reported on 08/06/2020 12/08/18 08/06/20  Clovis Riley, MD    Family History Family History  Problem Relation Age of Onset   Hypertension Father    Arthritis Other    Hypertension Other    Diabetes Maternal Grandmother    Cancer Neg Hx    Early death Neg Hx    Drug abuse Neg Hx  Heart disease Neg Hx    Hyperlipidemia Neg Hx    Kidney disease Neg Hx    Stroke Neg Hx     Social History Social History   Tobacco Use   Smoking status: Former    Packs/day: 0.05    Years: 1.00    Pack years: 0.05    Types: Cigarettes   Smokeless tobacco: Never  Vaping Use   Vaping Use: Never used  Substance Use Topics   Alcohol use: Yes    Alcohol/week: 5.0 standard drinks    Types: 5 Cans of beer per week   Drug use: No     Allergies   Patient has no known allergies.   Review of Systems Review of Systems Per HPI  Physical  Exam Triage Vital Signs ED Triage Vitals  Enc Vitals Group     BP 07/31/21 0927 (!) 144/79     Pulse Rate 07/31/21 0927 66     Resp 07/31/21 0927 16     Temp 07/31/21 0927 97.9 F (36.6 C)     Temp Source 07/31/21 0927 Oral     SpO2 07/31/21 0927 98 %     Weight --      Height --      Head Circumference --      Peak Flow --      Pain Score 07/31/21 0930 7     Pain Loc --      Pain Edu? --      Excl. in [place]? --    No data found.  Updated Vital Signs BP (!) 144/79 (BP Location: Left Arm)   Pulse 66   Temp 97.9 F (36.6 C) (Oral)   Resp 16   SpO2 98%   Visual Acuity Right Eye Distance:   Left Eye Distance:   Bilateral Distance:    Right Eye Near:   Left Eye Near:    Bilateral Near:     Physical Exam Constitutional:      General: He is not in acute distress.    Appearance: Normal appearance. He is not toxic-appearing or diaphoretic.  HENT:     Head: Normocephalic and atraumatic.     Right Ear: Tympanic membrane and ear canal normal.     Left Ear: Tympanic membrane and ear canal normal.     Nose: Congestion present.     Mouth/Throat:     Mouth: Mucous membranes are moist.     Pharynx: Posterior oropharyngeal erythema present.  Eyes:     Extraocular Movements: Extraocular movements intact.     Conjunctiva/sclera: Conjunctivae normal.     Pupils: Pupils are equal, round, and reactive to light.  Cardiovascular:     Rate and Rhythm: Normal rate and regular rhythm.     Pulses: Normal pulses.     Heart sounds: Normal heart sounds.  Pulmonary:     Effort: Pulmonary effort is normal. No respiratory distress.     Breath sounds: Normal breath sounds. No wheezing.  Abdominal:     General: Abdomen is flat. Bowel sounds are normal.     Palpations: Abdomen is soft.  Musculoskeletal:        General: Normal range of motion.     Cervical back: Normal range of motion.  Skin:    General: Skin is warm and dry.  Neurological:     General: No focal deficit present.      Mental Status: He is alert and oriented to person, place, and time. Mental status is at baseline.  Psychiatric:        Mood and Affect: Mood normal.        Behavior: Behavior normal.     UC Treatments / Results  Labs (all labs ordered are listed, but only abnormal results are displayed) Labs Reviewed  CULTURE, GROUP A STREP [facility])  POCT RAPID STREP A (OFFICE)  POCT INFLUENZA A/B    EKG   Radiology No results found.  Procedures Procedures (including critical care time)  Medications Ordered in UC Medications - No data to display  Initial Impression / Assessment and Plan / UC Course  I have reviewed the triage vital signs and the nursing notes.  Pertinent labs & imaging results that were available during my care of the patient were reviewed by me and considered in my medical decision making (see chart for details).     Patient presents with symptoms likely from a viral upper respiratory infection. Differential includes bacterial pneumonia, sinusitis, allergic rhinitis, Covid 19, flu. Do not suspect underlying cardiopulmonary process. Symptoms seem unlikely related to ACS, CHF or COPD exacerbations, pneumonia, pneumothorax. Patient is nontoxic appearing and not in need of emergent medical intervention.  Rapid flu test and strep test were negative.  Patient declined COVID-19 testing after patient was told that test was not a rapid test.  Patient reports that he will do an at home COVID test.  Recommended symptom control with over the counter medications: Daily oral anti-histamine, Oral decongestant or IN corticosteroid, saline irrigations, cepacol lozenges, Robitussin, Delsym, honey tea.  Return if symptoms fail to improve in 1-2 weeks or you develop shortness of breath, chest pain, severe headache. Patient states understanding and is agreeable.  Discharged with PCP followup.  Final Clinical Impressions(s) / UC Diagnoses   Final diagnoses:  Viral upper respiratory tract  infection with cough  Sore throat     Discharge Instructions      You likely having a viral upper respiratory infection. We recommended symptom control. I expect your symptoms to start improving in the next 1-2 weeks.   1. Take a daily allergy pill/anti-histamine like Zyrtec, Claritin, or Store brand consistently for 2 weeks  2. For congestion you may try an oral decongestant like Mucinex or sudafed. You may also try intranasal flonase nasal spray or saline irrigations (neti pot, sinus cleanse)  3. For your sore throat you may try cepacol lozenges, salt water gargles, throat spray. Treatment of congestion may also help your sore throat.  4. For cough you may try Robitussen, Mucinex DM  5. Take Tylenol or Ibuprofen to help with pain/inflammation  6. Stay hydrated, drink plenty of fluids to keep throat coated and less irritated  Honey Tea For cough/sore throat try using a honey-based tea. Use 3 teaspoons of honey with juice squeezed from half lemon. Place shaved pieces of ginger into 1/2-1 cup of water and warm over stove top. Then mix the ingredients and repeat every 4 hours as needed.   Your rapid strep and flu tests were negative.      ED Prescriptions   None    PDMP not reviewed this encounter.   Teodora Medici, [place] 07/31/21 1016

## 2021-07-31 NOTE — ED Triage Notes (Addendum)
Chills, nasal congestion, runny nose, and mild sore throat starting yesterday. Wants covid and flu testing before the holidays. Reporting mild cough with yellow mucus also

## 2021-08-03 LAB — CULTURE, GROUP A STREP (THRC)

## 2021-09-13 DIAGNOSIS — Z302 Encounter for sterilization: Secondary | ICD-10-CM | POA: Diagnosis not present

## 2021-09-22 NOTE — Progress Notes (Signed)
New Patient Note  RE: PROPHET RENWICK MRN: [id number] DOB: [birth year] Date of Office Visit: 09/23/2021  Consult requested by: Ronnell Freshwater, NP Primary care provider: Ronnell Freshwater, NP  Chief Complaint: Allergy Testing  History of Present Illness: I had the pleasure of seeing Evan Lee for initial evaluation at the [facility] on 09/24/2021. He is a 36 y.o. male, who is referred here by Ronnell Freshwater, NP for the evaluation of food and environmental allergies.  Food:  Patient noted abdominal cramps and irregular bowel movements with diarrhea. This prompted to get the bloodwork. Negative to celiac panel.  No prior GI evaluation.  Patient has been decreasing his gluten intake and noticed some improvement in his GI symptoms. He has been eating corn, peanuts, soy and beef with no issues.   Past work up includes: 2022 bloodwork was borderline positive to wheat, corn, peanut, soy, beef. Dietary History: patient has been eating other foods including limited milk, eggs, peanut, treenuts, sesame, shellfish, fish, soy, limited wheat, meats, fruits and vegetables.  Rhinitis:  He reports symptoms of nasal congestion, sneezing, rashes, rhinorrhea, itchy ears. Symptoms have been going on for many years. The symptoms are present all year around with worsening in spring and summer. Anosmia: no. Headache: yes. He has used Claritin, zyrtec, Flonase with minimal improvement in symptoms. Sinus infections: 3-4 per year. Previous work up includes: 2022 bloodwork positive to grass, cockroach, trees, ragweed, weed. Previous ENT evaluation: yes - had tubes as a child Previous sinus imaging: no. History of nasal polyps: no.s History of reflux: yes and does not take medications for this.  Referral note: "Chronic allergic rhinitis and dyspepsia. Multiple environmental and food positives on blood allergy screening test."  Component     Latest Ref Rng & Units 07/04/2021   Milk IgE     Class 0 kU/L <0.10  Wheat IgE     Class I kU/L 0.45 (A)  Allergen Corn, IgE     Class I kU/L 0.44 (A)  Peanut IgE     Class II kU/L 0.56 (A)  Soybean IgE     Class I kU/L 0.44 (A)  Pork IgE     Class 0 kU/L <0.10  Beef IgE     Class 0/I kU/L 0.18 (A)  Food Mix (Seafoods) IgE      Negative  Egg, Whole IgE     Class 0 kU/L <0.10  Chocolate/Cacao IgE     Class 0 kU/L <0.10   Component     Latest Ref Rng & Units 07/04/2021  IgE (Immunoglobulin E), Serum     6 - 495 IU/mL 29  D Pteronyssinus IgE     Class 0 kU/L <0.10  D Farinae IgE     Class 0/I kU/L 0.12 (A)  Cat Dander IgE     Class 0 kU/L <0.10  Dog Dander IgE     Class 0 kU/L <0.10  [place] Grass IgE     Class II kU/L 0.58 (A)  Ward Grass IgE     Class I kU/L 0.48 (A)  Johnson Grass IgE     Class I kU/L 0.55 (A)  Cockroach, German IgE     Class I kU/L 0.43 (A)  Penicillium Chrysogen IgE     Class 0 kU/L <0.10  Cladosporium Herbarum IgE     Class 0 kU/L <0.10  Aspergillus Fumigatus IgE     Class 0 kU/L <0.10  Alternaria Alternata IgE  Class 0 kU/L <0.10  Maple/Box Elder IgE     Class I kU/L 0.53 (A)  Common Silver Wendee Copp IgE     Class I kU/L 0.51 (A)  Cedar, Mountain IgE     Class I kU/L 0.47 (A)  Oak, White IgE     Class II kU/L 0.59 (A)  Elm, American IgE     Class I kU/L 0.53 (A)  Cottonwood IgE     Class I kU/L 0.55 (A)  Pecan, Hickory IgE     Class II kU/L 0.56 (A)  White Mulberry IgE     Class I kU/L 0.40 (A)  Ragweed, Short IgE     Class I kU/L 0.55 (A)  Pigweed, Rough IgE     Class I kU/L 0.50 (A)  Sheep Sorrel IgE Qn     Class I kU/L 0.52 (A)  Mouse Urine IgE     Class 0 kU/L <0.10    Assessment and Plan: Evan Lee is a 36 y.o. male with: Other allergic rhinitis Perennial rhinitis symptoms with worsening the spring and summer.  Tried Claritin, Zyrtec and Flonase with minimal benefit.  2020 blood work was positive to grass, cockroach, trees, ragweed and  weed. Today's skin testing showed: Positive to tree pollen, grass, ragweed. Borderline to mold and cockroach. Start environmental control measures as below. Use over the counter antihistamines such as Zyrtec (cetirizine), Claritin (loratadine), Allegra (fexofenadine), or Xyzal (levocetirizine) daily as needed. May take twice a day during allergy flares. May switch antihistamines every few months. Start Ryaltris (olopatadine + mometasone nasal spray combination) 1-2 sprays per nostril twice a day. Sample given. Consider allergy injections for long term control if above medications do not help the symptoms - handout given.   Frequent sinus infections 3-4 sinus infections per year.  No prior sinus surgery. Environmental allergies can predispose patients to increased sinus infections.  Keep track of infections/antibiotics use. 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 If no improvement with above regimen will get bloodwork next to look at immune system.  Other adverse food reactions, not elsewhere classified, subsequent encounter Abdominal cramps with irregular bowel movements with diarrhea.  PCP ordered blood work which showed borderline positives to wheat, corn, peanut, soy and beef.  Patient limited wheat from his diet with some benefit.  Tolerates corn, peanuts, soy and beef with no issues.  No prior GI evaluation. Denies any other symptoms. Today's skin testing showed: Negative to select foods.  Discussed with patient that skin prick testing and bloodwork (food IgE levels) check for IgE mediated reactions which his clinical presentation does not support.  If you notice not feeling well after eating gluten then recommend limiting/avoiding it. If still having a lot of GI issues recommend GI evaluation next. Bloodwork most likely irrelevant sensitization - okay to consume corn, peanuts, soy and beef.  Pruritus Itchy ear left ear canal. History of tympanostomy tube. Exam  normal.  Try itch cream on the ear. See below for proper skin care.   Local reaction to hymenoptera sting Large localized reactions in the past. Monitor symptoms.  Return in about 4 months (around 01/21/2022).  Meds ordered this encounter  Medications   RYALTRIS 665-25 MCG/ACT SUSP    Sig: Place 1-2 sprays into the nose in the morning and at bedtime.    Dispense:  29 g    Refill:  5   Lab Orders  No laboratory test(s) ordered today    Other allergy screening: Asthma: no  Medication allergy: no Hymenoptera allergy: no Large localized reactions Urticaria: no Eczema:no History of recurrent infections suggestive of immunodeficency: no  Diagnostics: Skin Testing: Environmental allergy panel and select foods. Positive to tree pollen, grass, ragweed Borderline to mold and cockroach. Negative to select foods.  Results discussed with patient/family.  Airborne Adult Perc - 09/23/21 1506     Time Antigen Placed 1506    Allergen Manufacturer Lavella Hammock    Location Back    Number of Test 59    Panel 1 Select    1. Control-Buffer 50% Glycerol Negative    2. Control-Histamine 1 mg/ml 2+    3. Albumin saline Negative    4. Carlton Negative    5. [place] Negative    6. Johnson Negative    7. [place] Blue Negative    8. Meadow Fescue Negative    9. Perennial Rye Negative    10. Sweet Vernal Negative    11. Elmer Negative    12. Cocklebur Negative    13. Burweed Marshelder Negative    14. Ragweed, short Negative    15. Ragweed, Giant Negative    16. Plantain,  English Negative    17. Lamb's Quarters Negative    18. Sheep Sorrell Negative    19. Rough Pigweed Negative    20. Marsh Elder, Rough Negative    21. Mugwort, Common Negative    22. Ash mix Negative    23. Birch mix Negative    24. Beech American Negative    25. Box, Elder Negative    26. Cedar, red Negative    27. Cottonwood, [place] Negative    28. Elm mix Negative    29. Hickory Negative    30. Maple mix 2+     31. Oak, [place] mix Negative    32. Pecan Pollen --   +/-   33. Pine mix Negative    34. Sycamore Eastern Negative    35. [place], Black Pollen Negative    36. Alternaria alternata Negative    37. Cladosporium Herbarum Negative    38. Aspergillus mix Negative    39. Penicillium mix Negative    40. Bipolaris sorokiniana (Helminthosporium) Negative    41. Drechslera spicifera (Curvularia) Negative    42. Mucor plumbeus Negative    43. Fusarium moniliforme Negative    44. Aureobasidium pullulans (pullulara) Negative    45. Rhizopus oryzae Negative    46. Botrytis cinera Negative    47. Epicoccum nigrum Negative    48. Phoma betae Negative    49. Candida Albicans Negative    50. Trichophyton mentagrophytes Negative    51. Mite, D Farinae  5,000 AU/ml Negative    52. Mite, D Pteronyssinus  5,000 AU/ml Negative    53. Cat Hair 10,000 BAU/ml Negative    54.  Dog Epithelia Negative    55. Mixed Feathers Negative    56. Horse Epithelia Negative    57. Cockroach, German Negative    58. Mouse Negative    59. Tobacco Leaf Negative             Intradermal - 09/23/21 1541     Time Antigen Placed 1546    Allergen Manufacturer Lavella Hammock    Location Arm    Number of Test 14    Intradermal Select    Control Negative    Gu[place] 2+    Johnson 2+    7 Grass --   +/-   Ragweed mix 2+    Weed mix Negative  Mold 1 Negative    Mold 2 --   +/-   Mold 3 Negative    Mold 4 Negative    Cat Negative    Dog Negative    Cockroach --   +/-   Mite mix Negative             Food Adult Perc - 09/23/21 1500     Time Antigen Placed 1506    Allergen Manufacturer Lavella Hammock    Location Back    Number of allergen test 11    1. Peanut Negative    2. Soybean Negative    3. Wheat Negative    4. Sesame Negative    5. Milk, cow Negative    6. Egg White, Chicken Negative    7. Casein Negative    8. Shellfish Mix Negative    9. Fish Mix Negative    40. Beef Negative    53. Corn Negative              Past Medical History: Patient Active Problem List   Diagnosis Date Noted   Frequent sinus infections 09/24/2021   Local reaction to hymenoptera sting 09/24/2021   Other adverse food reactions, not elsewhere classified, subsequent encounter 09/24/2021   Irritable bowel syndrome with both constipation and diarrhea 07/04/2021   Pruritus 07/04/2021   Other allergic rhinitis 07/04/2021   Restless legs syndrome 02/24/2021   Encounter to establish care 11/12/2020   Fatigue 11/12/2020   Inguinal hernia of left side without obstruction or gangrene 10/26/2018   Acute otitis media 09/03/2018   Acute maxillary sinusitis 09/03/2018   Entrapment of right ulnar nerve 07/02/2018   Numbness 07/02/2018   Bronchitis 03/01/2018   Myalgia 10/13/2017   Fever 10/13/2017   Generalized anxiety disorder 03/30/2016   Elevated fasting glucose 03/26/2016   Environmental and seasonal allergies 02/07/2016   Routine general medical examination at a health care facility 01/03/2013   Herpes genitalis in men 01/03/2013   TMJ arthralgia 01/03/2013   h/o RLS (restless legs syndrome) 01/03/2013   Past Medical History:  Diagnosis Date   Anxiety    Phreesia 11/06/2020   PONV (postoperative nausea and vomiting)    TMJ arthralgia 2014   + bruxism per his dentist   Past Surgical History: Past Surgical History:  Procedure Laterality Date   ANTERIOR CRUCIATE LIGAMENT REPAIR  09/08/2000   HERNIA REPAIR N/A    Phreesia 11/06/2020   INGUINAL HERNIA REPAIR Left 12/08/2018   Procedure: OPEN LEFT INGUINAL HERNIA REPAIR WITH MESH;  Surgeon: Clovis Riley, MD;  Location: [facility];  Service: General;  Laterality: Left;   TYMPANOSTOMY TUBE PLACEMENT     Medication List:  Current Outpatient Medications  Medication Sig Dispense Refill   clonazePAM (KLONOPIN) 0.5 MG tablet Take 1 tablet (0.5 mg total) by mouth at bedtime as needed for anxiety. 30 tablet 1   RYALTRIS 665-25 MCG/ACT  SUSP Place 1-2 sprays into the nose in the morning and at bedtime. 29 g 5   sertraline (ZOLOFT) 50 MG tablet Take 1 tablet (50 mg total) by mouth daily. 90 tablet 1   No current facility-administered medications for this visit.   Allergies: No Known Allergies Social History: Social History   Socioeconomic History   Marital status: Married    Spouse name: Not on file   Number of children: Not on file   Years of education: Not on file   Highest education level: Not on file  Occupational History  Not on file  Tobacco Use   Smoking status: Former    Packs/day: 0.05    Years: 1.00    Pack years: 0.05    Types: Cigarettes    Passive exposure: Never   Smokeless tobacco: Never  Vaping Use   Vaping Use: Never used  Substance and Sexual Activity   Alcohol use: Yes    Alcohol/week: 5.0 standard drinks    Types: 5 Cans of beer per week   Drug use: No   Sexual activity: Yes  Other Topics Concern   Not on file  Social History Narrative   ** Merged History Encounter **       Social Determinants of Health   Financial Resource Strain: Not on file  Food Insecurity: Not on file  Transportation Needs: Not on file  Physical Activity: Not on file  Stress: Not on file  Social Connections: Not on file   Lives in a house built in [place]. Smoking: denies Occupation: Fish farm manager History: Environmental education officer in the house: no Charity fundraiser in the family room: no Carpet in the bedroom: no Heating: electric Cooling: central Pet: yes 1 dog  x 12 yrs  Family History: Family History  Problem Relation Age of Onset   Eczema Father    Hypertension Father    Diabetes Maternal Grandmother    Arthritis Other    Hypertension Other    Cancer Neg Hx    Early death Neg Hx    Drug abuse Neg Hx    Heart disease Neg Hx    Hyperlipidemia Neg Hx    Kidney disease Neg Hx    Stroke Neg Hx    Problem                               Relation Asthma                                    no Eczema                                father Food allergy                          no Allergic rhino conjunctivitis     no  Review of Systems  Constitutional:  Negative for appetite change, chills, fever and unexpected weight change.  HENT:  Positive for congestion, rhinorrhea and sneezing.   Eyes:  Negative for itching.  Respiratory:  Negative for cough, chest tightness, shortness of breath and wheezing.   Cardiovascular:  Negative for chest pain.  Gastrointestinal:  Positive for diarrhea. Negative for abdominal pain.       Cramping  Genitourinary:  Negative for difficulty urinating.  Skin:  Positive for rash.  Allergic/Immunologic: Positive for environmental allergies. Negative for food allergies.  Neurological:  Positive for headaches.   Objective: BP 138/82 (BP Location: Left Arm, Patient Position: Sitting, Cuff Size: Normal)    Pulse 73    Temp 98.3 F (36.8 C) (Temporal)    Resp 18    Ht 5\' 11"  (1.803 m)    Wt 169 lb (76.7 kg)    SpO2 98%    BMI 23.57 kg/m  Body mass index is 23.57 kg/m. Physical Exam Vitals and nursing  note reviewed.  Constitutional:      Appearance: Normal appearance. He is well-developed.  HENT:     Head: Normocephalic and atraumatic.     Right Ear: External ear normal.     Left Ear: External ear normal.     Ears:     Comments: Scarring on the TM b/l.     Nose: Nose normal.     Mouth/Throat:     Mouth: Mucous membranes are moist.     Pharynx: Oropharynx is clear.  Eyes:     Conjunctiva/sclera: Conjunctivae normal.  Cardiovascular:     Rate and Rhythm: Normal rate and regular rhythm.     Heart sounds: Normal heart sounds. No murmur heard.   No friction rub. No gallop.  Pulmonary:     Effort: Pulmonary effort is normal.     Breath sounds: Normal breath sounds. No wheezing, rhonchi or rales.  Musculoskeletal:     Cervical back: Neck supple.  Skin:    General: Skin is warm.     Findings: No rash.  Neurological:     Mental Status: He  is alert and oriented to person, place, and time.  Psychiatric:        Behavior: Behavior normal.  The plan was reviewed with the patient/family, and all questions/concerned were addressed.  It was my pleasure to see Evan Lee today and participate in his care. Please feel free to contact me with any questions or concerns.  Sincerely,  Rexene Alberts, DO Allergy & Immunology  [facility] office: [place] office: [phone]

## 2021-09-23 ENCOUNTER — Encounter: Payer: Self-pay | Admitting: Allergy

## 2021-09-23 ENCOUNTER — Other Ambulatory Visit: Payer: Self-pay

## 2021-09-23 ENCOUNTER — Ambulatory Visit: Payer: BC Managed Care – PPO | Admitting: Allergy

## 2021-09-23 VITALS — BP 138/82 | HR 73 | Temp 98.3°F | Resp 18 | Ht 71.0 in | Wt 169.0 lb

## 2021-09-23 DIAGNOSIS — T7840XA Allergy, unspecified, initial encounter: Secondary | ICD-10-CM

## 2021-09-23 DIAGNOSIS — L299 Pruritus, unspecified: Secondary | ICD-10-CM | POA: Diagnosis not present

## 2021-09-23 DIAGNOSIS — J3089 Other allergic rhinitis: Secondary | ICD-10-CM

## 2021-09-23 DIAGNOSIS — T63481A Toxic effect of venom of other arthropod, accidental (unintentional), initial encounter: Secondary | ICD-10-CM

## 2021-09-23 DIAGNOSIS — J329 Chronic sinusitis, unspecified: Secondary | ICD-10-CM

## 2021-09-23 DIAGNOSIS — T781XXD Other adverse food reactions, not elsewhere classified, subsequent encounter: Secondary | ICD-10-CM

## 2021-09-23 MED ORDER — RYALTRIS 665-25 MCG/ACT NA SUSP: 1.0000 | Freq: Two times a day (BID) | NASAL | 5 refills | Status: DC

## 2021-09-23 NOTE — Patient Instructions (Addendum)
Today's skin testing showed: Positive to tree pollen, grass, ragweed Borderline to mold and cockroach. Negative to select foods.   Results given.  Environmental allergies Start environmental control measures as below. Use over the counter antihistamines such as Zyrtec (cetirizine), Claritin (loratadine), Allegra (fexofenadine), or Xyzal (levocetirizine) daily as needed. May take twice a day during allergy flares. May switch antihistamines every few months. Start Ryaltris (olopatadine + mometasone nasal spray combination) 1-2 sprays per nostril twice a day. Sample given. Consider allergy injections for long term control if above medications do not help the symptoms - handout given.   Food Discussed with patient that skin prick testing and bloodwork (food IgE levels) check for IgE mediated reactions which his clinical presentation does not support.  If you notice not feeling well after eating gluten then recommend limiting/avoiding it. If still having a lot of GI issues recommend GI evaluation next. Bloodwork most likely irrelevant sensitization.   Sinus infections Keep track of infections/antibiotics use. Make sure to use saline rinse after work.   Itchy ears/skin Try itch cream on the ear. See below for proper skin care.   Follow up in 4 months or sooner if needed.    Reducing Pollen Exposure Pollen seasons: trees (spring), grass (summer) and ragweed/weeds (fall). Keep windows closed in your home and car to lower pollen exposure.  Install air conditioning in the bedroom and throughout the house if possible.  Avoid going out in dry windy days - especially early morning. Pollen counts are highest between 5 - 10 AM and on dry, hot and windy days.  Save outside activities for late afternoon or after a heavy rain, when pollen levels are lower.  Avoid mowing of grass if you have grass pollen allergy. Be aware that pollen can also be transported indoors on people and pets.  Dry your  clothes in an automatic dryer rather than hanging them outside where they might collect pollen.  Rinse hair and eyes before bedtime. Mold Control Mold and fungi can grow on a variety of surfaces provided certain temperature and moisture conditions exist.  Outdoor molds grow on plants, decaying vegetation and soil. The major outdoor mold, Alternaria and Cladosporium, are found in very high numbers during hot and dry conditions. Generally, a late summer - fall peak is seen for common outdoor fungal spores. Rain will temporarily lower outdoor mold spore count, but counts rise rapidly when the rainy period ends. The most important indoor molds are Aspergillus and Penicillium. Dark, humid and poorly ventilated basements are ideal sites for mold growth. The next most common sites of mold growth are the bathroom and the kitchen. Outdoor (Seasonal) Mold Control Use air conditioning and keep windows closed. Avoid exposure to decaying vegetation. Avoid leaf raking. Avoid grain handling. Consider wearing a face mask if working in moldy areas.  Indoor (Perennial) Mold Control  Maintain humidity below 50%. Get rid of mold growth on hard surfaces with water, detergent and, if necessary, 5% bleach (do not mix with other cleaners). Then dry the area completely. If mold covers an area more than 10 square feet, consider hiring an indoor environmental professional. For clothing, washing with soap and water is best. If moldy items cannot be cleaned and dried, throw them away. Remove sources e.g. contaminated carpets. Repair and seal leaking roofs or pipes. Using dehumidifiers in damp basements may be helpful, but empty the water and clean units regularly to prevent mildew from forming. All rooms, especially basements, bathrooms and kitchens, require ventilation and cleaning to  deter mold and mildew growth. Avoid carpeting on concrete or damp floors, and storing items in damp areas. Cockroach Allergen  Avoidance Cockroaches are often found in the homes of densely populated urban areas, schools or commercial buildings, but these creatures can lurk almost anywhere. This does not mean that you have a dirty house or living area. Block all areas where roaches can enter the home. This includes crevices, wall cracks and windows.  Cockroaches need water to survive, so fix and seal all leaky faucets and pipes. Have an exterminator go through the house when your family and pets are gone to eliminate any remaining roaches. Keep food in lidded containers and put pet food dishes away after your pets are done eating. Vacuum and sweep the floor after meals, and take out garbage and recyclables. Use lidded garbage containers in the kitchen. Wash dishes immediately after use and clean under stoves, refrigerators or toasters where crumbs can accumulate. Wipe off the stove and other kitchen surfaces and cupboards regularly.  Skin care recommendations  Bath time: Always use lukewarm water. AVOID very hot or cold water. Keep bathing time to 5-10 minutes. Do NOT use bubble bath. Use a mild soap and use just enough to wash the dirty areas. Do NOT scrub skin vigorously.  After bathing, pat dry your skin with a towel. Do NOT rub or scrub the skin.  Moisturizers and prescriptions:  ALWAYS apply moisturizers immediately after bathing (within 3 minutes). This helps to lock-in moisture. Use the moisturizer several times a day over the whole body. Good summer moisturizers include: Aveeno, CeraVe, Cetaphil. Good winter moisturizers include: Aquaphor, Vaseline, Cerave, Cetaphil, Eucerin, Vanicream. When using moisturizers along with medications, the moisturizer should be applied about one hour after applying the medication to prevent diluting effect of the medication or moisturize around where you applied the medications. When not using medications, the moisturizer can be continued twice daily as maintenance.  Laundry and  clothing: Avoid laundry products with added color or perfumes. Use unscented hypo-allergenic laundry products such as Tide free, Cheer free & gentle, and All free and clear.  If the skin still seems dry or sensitive, you can try double-rinsing the clothes. Avoid tight or scratchy clothing such as wool. Do not use fabric softeners or dyer sheets.

## 2021-09-24 DIAGNOSIS — T63481A Toxic effect of venom of other arthropod, accidental (unintentional), initial encounter: Secondary | ICD-10-CM | POA: Insufficient documentation

## 2021-09-24 DIAGNOSIS — T781XXD Other adverse food reactions, not elsewhere classified, subsequent encounter: Secondary | ICD-10-CM | POA: Insufficient documentation

## 2021-09-24 DIAGNOSIS — J329 Chronic sinusitis, unspecified: Secondary | ICD-10-CM | POA: Insufficient documentation

## 2021-09-24 NOTE — Assessment & Plan Note (Signed)
Abdominal cramps with irregular bowel movements with diarrhea.  PCP ordered blood work which showed borderline positives to wheat, corn, peanut, soy and beef.  Patient limited wheat from his diet with some benefit.  Tolerates corn, peanuts, soy and beef with no issues.  No prior GI evaluation. Denies any other symptoms.  Today's skin testing showed: Negative to select foods.   Discussed with patient that skin prick testing and bloodwork (food IgE levels) check for IgE mediated reactions which his clinical presentation does not support.   If you notice not feeling well after eating gluten then recommend limiting/avoiding it.  If still having a lot of GI issues recommend GI evaluation next.  Bloodwork most likely irrelevant sensitization - okay to consume corn, peanuts, soy and beef.

## 2021-09-24 NOTE — Assessment & Plan Note (Signed)
Large localized reactions in the past.  Monitor symptoms.

## 2021-09-24 NOTE — Assessment & Plan Note (Signed)
Itchy ear left ear canal. History of tympanostomy tube.  Exam normal.   Try itch cream on the ear.  See below for proper skin care.

## 2021-09-24 NOTE — Assessment & Plan Note (Signed)
Perennial rhinitis symptoms with worsening the spring and summer.  Tried Claritin, Zyrtec and Flonase with minimal benefit.  2020 blood work was positive to grass, cockroach, trees, ragweed and weed.  Today's skin testing showed: Positive to tree pollen, grass, ragweed. Borderline to mold and cockroach.  Start environmental control measures as below.  Use over the counter antihistamines such as Zyrtec (cetirizine), Claritin (loratadine), Allegra (fexofenadine), or Xyzal (levocetirizine) daily as needed. May take twice a day during allergy flares. May switch antihistamines every few months.  Start Ryaltris (olopatadine + mometasone nasal spray combination) 1-2 sprays per nostril twice a day. Sample given.  Consider allergy injections for long term control if above medications do not help the symptoms - handout given.

## 2021-09-24 NOTE — Assessment & Plan Note (Signed)
3-4 sinus infections per year.  No prior sinus surgery.  Environmental allergies can predispose patients to increased sinus infections.   Keep track of infections/antibiotics use.  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  If no improvement with above regimen will get bloodwork next to look at immune system.

## 2021-11-10 ENCOUNTER — Encounter: Payer: Self-pay | Admitting: Nurse Practitioner

## 2021-11-13 ENCOUNTER — Ambulatory Visit: Payer: BC Managed Care – PPO | Admitting: Nurse Practitioner

## 2021-11-13 ENCOUNTER — Encounter: Payer: Self-pay | Admitting: Nurse Practitioner

## 2021-11-13 ENCOUNTER — Other Ambulatory Visit: Payer: Self-pay

## 2021-11-13 VITALS — BP 113/76 | HR 69 | Temp 97.8°F | Ht 71.0 in | Wt 168.3 lb

## 2021-11-13 DIAGNOSIS — F411 Generalized anxiety disorder: Secondary | ICD-10-CM

## 2021-11-13 DIAGNOSIS — H608X2 Other otitis externa, left ear: Secondary | ICD-10-CM

## 2021-11-13 DIAGNOSIS — F5101 Primary insomnia: Secondary | ICD-10-CM

## 2021-11-13 MED ORDER — SERTRALINE HCL 50 MG PO TABS: 50.0000 mg | ORAL_TABLET | Freq: Every day | ORAL | 1 refills | Status: DC

## 2021-11-13 MED ORDER — HYDROCORTISONE-ACETIC ACID 1-2 % OT SOLN: 4.0000 [drp] | Freq: Two times a day (BID) | OTIC | 1 refills | Status: DC

## 2021-11-13 MED ORDER — ZOLPIDEM TARTRATE ER 6.25 MG PO TBCR: 6.2500 mg | EXTENDED_RELEASE_TABLET | Freq: Every evening | ORAL | 2 refills | Status: DC | PRN

## 2021-11-13 NOTE — Progress Notes (Signed)
Established patient visit ? ? ?Patient: Evan Lee   DOB: 02/15/1986   36 y.o. Male  MRN: [id number] ?Visit Date: 11/13/2021 ? ? ?Chief Complaint  ?Patient presents with  ? Medication Refill  ? ?Subjective  ?  ?HPI  ?The patient is here for follow up of mood. Currently taking sertraline 50 mg daily. Did have a time when he weaned himself completely off of the medication. Saw that he had increased irritability, short tempered while off the mediation. States that since he restarted this medication, he feels much better.  ?States that he has really had a lot of trouble sleeping. States that falling asleep and staying asleep are both a problem. He does have some restless legs, especially when he can't sleep. Has taken clonazepam in the past. This does help with sleep however, he feels tired and groggy the next day. Would like to try something different that would get out of his system quickly so that he does not wake up sleepy the next day.  ?He mentions that he has had itching of the left ear. Doesn't hurt. Has tried trimming the hair. Has had this looked at [per his allergy doctor. Nothing was reported to be wrong. Would like to have this looked at.  ? ? ?Medications: ?Outpatient Medications Prior to Visit  ?Medication Sig Note  ? RYALTRIS [id number] MCG/ACT SUSP Place 1-2 sprays into the nose in the morning and at bedtime.   ? [DISCONTINUED] clonazePAM (KLONOPIN) 0.5 MG tablet Take 1 tablet (0.5 mg total) by mouth at bedtime as needed for anxiety. 11/13/2021: causes him to feel groggy the next day   ? [DISCONTINUED] sertraline (ZOLOFT) 50 MG tablet Take 1 tablet (50 mg total) by mouth daily.   ? ?No facility-administered medications prior to visit.  ? ? ?Review of Systems  ?Constitutional:  Negative for activity change, chills, fatigue and fever.  ?HENT:  Negative for congestion, postnasal drip, rhinorrhea, sinus pressure, sinus pain, sneezing and sore throat.   ?     Left ear itching   ?Eyes: Negative.    ?Respiratory:  Negative for cough, shortness of breath and wheezing.   ?Cardiovascular:  Negative for chest pain and palpitations.  ?Gastrointestinal:  Negative for constipation, diarrhea, nausea and vomiting.  ?Endocrine: Negative for cold intolerance, heat intolerance, polydipsia and polyuria.  ?Genitourinary:  Negative for dysuria, frequency and urgency.  ?Musculoskeletal:  Negative for back pain and myalgias.  ?Skin:  Negative for rash.  ?Allergic/Immunologic: Negative for environmental allergies.  ?Neurological:  Negative for dizziness, weakness and headaches.  ?Psychiatric/Behavioral:  Positive for dysphoric mood. The patient is nervous/anxious.   ? ? ? ? Objective  ?  ?BP 113/76   Pulse 69   Temp 97.8 ?F (36.6 ?C)   Ht '5\' 11"'$  (1.803 m)   Wt 168 lb 4.8 oz (76.3 kg)   SpO2 98%   BMI 23.47 kg/m?  ?BP Readings from Last 3 Encounters:  ?11/13/21 113/76  ?09/23/21 138/82  ?07/31/21 (!) 144/79  ?  ?Wt Readings from Last 3 Encounters:  ?11/13/21 168 lb 4.8 oz (76.3 kg)  ?09/23/21 169 lb (76.7 kg)  ?07/03/21 158 lb 11.2 oz (72 kg)  ?  ?Physical Exam ?Vitals and nursing note reviewed.  ?Constitutional:   ?   Appearance: Normal appearance. He is well-developed.  ?HENT:  ?   Head: Normocephalic.  ?   Right Ear: Hearing, tympanic membrane, ear canal and external ear normal.  ?   Left Ear: Ear canal and external  ear normal. Swelling present. Tympanic membrane is erythematous and bulging.  ?   Nose: Nose normal.  ?   Mouth/Throat:  ?   Mouth: Mucous membranes are moist.  ?   Pharynx: Oropharynx is clear.  ?Eyes:  ?   Extraocular Movements: Extraocular movements intact.  ?   Conjunctiva/sclera: Conjunctivae normal.  ?   Pupils: Pupils are equal, round, and reactive to light.  ?Cardiovascular:  ?   Rate and Rhythm: Normal rate and regular rhythm.  ?   Pulses: Normal pulses.  ?   Heart sounds: Normal heart sounds.  ?Pulmonary:  ?   Effort: Pulmonary effort is normal.  ?   Breath sounds: Normal breath sounds.   ?Abdominal:  ?   Palpations: Abdomen is soft.  ?Musculoskeletal:     ?   General: Normal range of motion.  ?   Cervical back: Normal range of motion and neck supple.  ?Lymphadenopathy:  ?   Cervical: No cervical adenopathy.  ?Skin: ?   General: Skin is warm and dry.  ?   Capillary Refill: Capillary refill takes less than 2 seconds.  ?Neurological:  ?   General: No focal deficit present.  ?   Mental Status: He is alert and oriented to person, place, and time.  ?Psychiatric:     ?   Mood and Affect: Mood normal.     ?   Behavior: Behavior normal.     ?   Thought Content: Thought content normal.     ?   Judgment: Judgment normal.  ?  ? ? Assessment & Plan  ?  ?1. Chronic eczematous otitis externa of left ear ?Start acetic acid/hydrocortisone ear drops. Insert four drops into right ear twice daily for next 5 to 7 days. Advised him to contact the office if no improvement over next 3 to 4 days.  ?- acetic acid-hydrocortisone (VOSOL-HC) OTIC solution; Place 4 drops into both ears 2 (two) times daily.  Dispense: 10 mL; Refill: 1 ? ?2. Generalized anxiety disorder ?Stable. Continue sertraline '50mg'$  daily. Refills provided today.  ?- sertraline (ZOLOFT) 50 MG tablet; Take 1 tablet (50 mg total) by mouth daily.  Dispense: 90 tablet; Refill: 1 ? ?3. Primary insomnia ?Trial of ambien CR 6.'25mg'$  at bedtime as needed for insomnia. New prescription sent to his pharmacy today. Reviewed risks and side effects associated with taking ambine and patient voiced understanding  ? ? ?- zolpidem (AMBIEN CR) 6.25 MG CR tablet; Take 1 tablet (6.25 mg total) by mouth at bedtime as needed for sleep.  Dispense: 30 tablet; Refill: 2  ?Problem List Items Addressed This Visit   ? ?  ? Nervous and Auditory  ? Chronic eczematous otitis externa of left ear - Primary  ? Relevant Medications  ? acetic acid-hydrocortisone (VOSOL-HC) OTIC solution  ?  ? Other  ? Generalized anxiety disorder  ? Relevant Medications  ? sertraline (ZOLOFT) 50 MG tablet  ?  Primary insomnia  ? Relevant Medications  ? zolpidem (AMBIEN CR) 6.25 MG CR tablet  ?  ? ?Return in about 3 months (around 02/13/2022) for mood, insomnia .  ?   ? ? ? ? ?Ronnell Freshwater, NP  ?[facility] ?[phone] (phone) ?[phone] (fax) ? ?[facility]  ?

## 2021-11-17 DIAGNOSIS — H608X2 Other otitis externa, left ear: Secondary | ICD-10-CM | POA: Insufficient documentation

## 2021-11-17 DIAGNOSIS — F5101 Primary insomnia: Secondary | ICD-10-CM | POA: Insufficient documentation

## 2021-11-18 ENCOUNTER — Other Ambulatory Visit: Payer: Self-pay | Admitting: Nurse Practitioner

## 2021-11-18 ENCOUNTER — Encounter: Payer: Self-pay | Admitting: Nurse Practitioner

## 2021-11-18 DIAGNOSIS — F5101 Primary insomnia: Secondary | ICD-10-CM

## 2021-11-18 MED ORDER — ZOLPIDEM TARTRATE 10 MG PO TABS: 10.0000 mg | ORAL_TABLET | Freq: Every evening | ORAL | 1 refills | Status: DC | PRN

## 2021-11-18 NOTE — Progress Notes (Signed)
Changed ambien CR 6.'25mg'$  to [place] '10mg'$  every evening as needed. New rx sent to pharmacy  ?

## 2021-12-13 ENCOUNTER — Ambulatory Visit
Admission: EM | Admit: 2021-12-13 | Discharge: 2021-12-13 | Disposition: A | Discharge status: Home / Self Care | Payer: BC Managed Care – PPO | Attending: Student | Admitting: Student

## 2021-12-13 DIAGNOSIS — S61411A Laceration without foreign body of right hand, initial encounter: Secondary | ICD-10-CM

## 2021-12-13 NOTE — ED Provider Notes (Signed)
?[facility] ? ? ? ?CSN: [id number] ?Arrival date & time: 12/13/21  1450 ? ? ?  ? ?History   ?Chief Complaint ?Chief Complaint  ?Patient presents with  ? right hand laceration  ? ? ?HPI ?Evan Lee is a 36 y.o. male presenting with R hand laceration sustained earlier today with a clean knife while doing home project. Denies sensation changes. Bleeding is controlled, he is not immunocompromised, tdap UTD 2017.  ? ?HPI ? ?Past Medical History:  ?Diagnosis Date  ? Anxiety   ? Phreesia 11/06/2020  ? PONV (postoperative nausea and vomiting)   ? TMJ arthralgia 2014  ? + bruxism per his dentist  ? ? ?Patient Active Problem List  ? Diagnosis Date Noted  ? Chronic eczematous otitis externa of left ear 11/17/2021  ? Primary insomnia 11/17/2021  ? Frequent sinus infections 09/24/2021  ? Local reaction to hymenoptera sting 09/24/2021  ? Other adverse food reactions, not elsewhere classified, subsequent encounter 09/24/2021  ? Irritable bowel syndrome with both constipation and diarrhea 07/04/2021  ? Pruritus 07/04/2021  ? Other allergic rhinitis 07/04/2021  ? Restless legs syndrome 02/24/2021  ? Encounter to establish care 11/12/2020  ? Fatigue 11/12/2020  ? Inguinal hernia of left side without obstruction or gangrene 10/26/2018  ? Acute otitis media 09/03/2018  ? Acute maxillary sinusitis 09/03/2018  ? Entrapment of right ulnar nerve 07/02/2018  ? Numbness 07/02/2018  ? Bronchitis 03/01/2018  ? Myalgia 10/13/2017  ? Fever 10/13/2017  ? Generalized anxiety disorder 03/30/2016  ? Elevated fasting glucose 03/26/2016  ? Environmental and seasonal allergies 02/07/2016  ? Routine general medical examination at a health care facility 01/03/2013  ? Herpes genitalis in men 01/03/2013  ? TMJ arthralgia 01/03/2013  ? h/o RLS (restless legs syndrome) 01/03/2013  ? ? ?Past Surgical History:  ?Procedure Laterality Date  ? ANTERIOR CRUCIATE LIGAMENT REPAIR  09/08/2000  ? HERNIA REPAIR N/A   ? Phreesia 11/06/2020  ?  INGUINAL HERNIA REPAIR Left 12/08/2018  ? Procedure: OPEN LEFT INGUINAL HERNIA REPAIR WITH MESH;  Surgeon: Clovis Riley, MD;  Location: [facility];  Service: General;  Laterality: Left;  ? TYMPANOSTOMY TUBE PLACEMENT    ? ? ? ? ? ?Home Medications   ? ?Prior to Admission medications   ?Medication Sig Start Date End Date Taking? Authorizing Provider  ?acetic acid-hydrocortisone (VOSOL-HC) OTIC solution Place 4 drops into both ears 2 (two) times daily. 11/13/21   Ronnell Freshwater, NP  ?Rennie Plowman [id number] MCG/ACT SUSP Place 1-2 sprays into the nose in the morning and at bedtime. 09/23/21   Garnet Sierras, DO  ?sertraline (ZOLOFT) 50 MG tablet Take 1 tablet (50 mg total) by mouth daily. 11/13/21   Ronnell Freshwater, NP  ?zolpidem (AMBIEN) 10 MG tablet Take 1 tablet (10 mg total) by mouth at bedtime as needed for sleep. 11/18/21 12/18/21  Ronnell Freshwater, NP  ?gabapentin (NEURONTIN) 300 MG capsule Take 1 capsule (300 mg total) by mouth 3 (three) times daily. ?Patient not taking: Reported on 08/06/2020 12/08/18 08/06/20  Clovis Riley, MD  ? ? ?Family History ?Family History  ?Problem Relation Age of Onset  ? Eczema Father   ? Hypertension Father   ? Diabetes Maternal Grandmother   ? Arthritis Other   ? Hypertension Other   ? Cancer Neg Hx   ? Early death Neg Hx   ? Drug abuse Neg Hx   ? Heart disease Neg Hx   ? Hyperlipidemia Neg  Hx   ? Kidney disease Neg Hx   ? Stroke Neg Hx   ? ? ?Social History ?Social History  ? ?Tobacco Use  ? Smoking status: Former  ?  Packs/day: 0.05  ?  Years: 1.00  ?  Pack years: 0.05  ?  Types: Cigarettes  ?  Passive exposure: Never  ? Smokeless tobacco: Never  ?Vaping Use  ? Vaping Use: Never used  ?Substance Use Topics  ? Alcohol use: Yes  ?  Alcohol/week: 5.0 standard drinks  ?  Types: 5 Cans of beer per week  ? Drug use: No  ? ? ? ?Allergies   ?Patient has no known allergies. ? ? ?Review of Systems ?Review of Systems  ?Skin:  Positive for wound.  ?All other systems reviewed and  are negative. ? ? ?Physical Exam ?Triage Vital Signs ?ED Triage Vitals  ?Enc Vitals Group  ?   BP 12/13/21 1519 (!) 129/91  ?   Pulse Rate 12/13/21 1519 91  ?   Resp 12/13/21 1519 18  ?   Temp 12/13/21 1519 98.1 ?F (36.7 ?C)  ?   Temp Source 12/13/21 1519 Oral  ?   SpO2 12/13/21 1519 98 %  ?   Weight --   ?   Height --   ?   Head Circumference --   ?   Peak Flow --   ?   Pain Score 12/13/21 1521 0  ?   Pain Loc --   ?   Pain Edu? --   ?   Excl. in [place]? --   ? ?No data found. ? ?Updated Vital Signs ?BP (!) 129/91 (BP Location: Left Arm)   Pulse 91   Temp 98.1 ?F (36.7 ?C) (Oral)   Resp 18   SpO2 98%  ? ?Visual Acuity ?Right Eye Distance:   ?Left Eye Distance:   ?Bilateral Distance:   ? ?Right Eye Near:   ?Left Eye Near:    ?Bilateral Near:    ? ?Physical Exam ?Vitals reviewed.  ?Constitutional:   ?   General: He is not in acute distress. ?   Appearance: Normal appearance. He is not ill-appearing.  ?HENT:  ?   Head: Normocephalic and atraumatic.  ?Pulmonary:  ?   Effort: Pulmonary effort is normal.  ?Skin: ?   Comments: See image below.  ?R hand - thenar pad with 1.5cm deep laceration, active bleeding. Surrounding sensation intact. ROM thumb abduction and adduction intact and without pain. Cap refill <2 seconds, radial pulse 2+. No snuffbox tenderness.   ?Neurological:  ?   General: No focal deficit present.  ?   Mental Status: He is alert and oriented to person, place, and time.  ?Psychiatric:     ?   Mood and Affect: Mood normal.     ?   Behavior: Behavior normal.     ?   Thought Content: Thought content normal.     ?   Judgment: Judgment normal.  ? ? ? ? ?UC Treatments / Results  ?Labs ?(all labs ordered are listed, but only abnormal results are displayed) ?Labs Reviewed - No data to display ? ?EKG ? ? ?Radiology ?No results found. ? ?Procedures ?Laceration Repair ? ?Date/Time: 12/13/2021 4:14 PM ?Performed by: Hazel Sams, PA-C ?Authorized by: Hazel Sams, PA-C  ? ?Consent:  ?  Consent obtained:  Verbal ?   Consent given by:  Patient ?  Risks, benefits, and alternatives were discussed: yes   ?  Risks discussed:  Infection, need for additional repair, pain and poor cosmetic result ?  Alternatives discussed:  No treatment ?Universal protocol:  ?  Procedure explained and questions answered to patient or proxy's satisfaction: yes   ?  Immediately prior to procedure, a time out was called: yes   ?  Patient identity confirmed:  Verbally with patient and arm band ?Anesthesia:  ?  Anesthesia method:  Local infiltration ?  Local anesthetic:  Lidocaine 1% w/o epi ?Laceration details:  ?  Location:  Hand ?  Hand location:  R palm ?  Length (cm):  1.5 ?Pre-procedure details:  ?  Preparation:  Patient was prepped and draped in usual sterile fashion ?Treatment:  ?  Area cleansed with:  Chlorhexidine and saline ?  Amount of cleaning:  Extensive ?  Irrigation solution:  Sterile saline ?Skin repair:  ?  Repair method:  Sutures ?  Suture size:  4-0 ?  Wound skin closure material used: nonabsorbable. ?  Suture technique:  Simple interrupted ?  Number of sutures:  4 ?Approximation:  ?  Approximation:  Close ?Post-procedure details:  ?  Dressing:  Adhesive bandage ?  Procedure completion:  Tolerated well, no immediate complications (including critical care time) ? ?Medications Ordered in UC ?Medications - No data to display ? ?Initial Impression / Assessment and Plan / UC Course  ?I have reviewed the triage vital signs and the nursing notes. ? ?Pertinent labs & imaging results that were available during my care of the patient were reviewed by me and considered in my medical decision making (see chart for details). ? ?  ? ?This patient is a very pleasant 36 y.o. year old male presenting with R thenar pad laceration sustained few hours ago. Neurovascularly intact. He is not immunocompromised. Tdap UTD 2017. 4 nonabsorbable sutures applied as above. He tolerated this well. Return in about 10 days for removal.  ? ?Final Clinical  Impressions(s) / UC Diagnoses  ? ?Final diagnoses:  ?Laceration of right hand without foreign body, initial encounter  ? ? ? ?Discharge Instructions   ? ?  ?-Return in 10 days for suture removal  ?-Wash with gentle soa

## 2021-12-13 NOTE — ED Triage Notes (Signed)
Pt c/o laceration to right hand at base of thumb today messing with tools for a home project ?

## 2021-12-13 NOTE — Discharge Instructions (Addendum)
-  Return in 10 days for suture removal  ?-Wash with gentle soap and water only. Avoid neosporin or ointment.  ?-Follow-up sooner if redness, swelling, pus, fevers/chills  ?

## 2021-12-16 ENCOUNTER — Encounter: Payer: Self-pay | Admitting: Nurse Practitioner

## 2022-01-09 ENCOUNTER — Ambulatory Visit: Payer: BC Managed Care – PPO | Admitting: Nurse Practitioner

## 2022-01-13 ENCOUNTER — Ambulatory Visit: Payer: BC Managed Care – PPO | Admitting: Nurse Practitioner

## 2022-01-15 ENCOUNTER — Ambulatory Visit (INDEPENDENT_AMBULATORY_CARE_PROVIDER_SITE_OTHER): Payer: BC Managed Care – PPO | Admitting: Nurse Practitioner

## 2022-01-15 ENCOUNTER — Encounter: Payer: Self-pay | Admitting: Nurse Practitioner

## 2022-01-15 VITALS — BP 127/84 | HR 81 | Temp 98.1°F | Ht 70.87 in | Wt 166.1 lb

## 2022-01-15 DIAGNOSIS — R0789 Other chest pain: Secondary | ICD-10-CM | POA: Insufficient documentation

## 2022-01-15 DIAGNOSIS — Q676 Pectus excavatum: Secondary | ICD-10-CM | POA: Diagnosis not present

## 2022-01-15 DIAGNOSIS — F411 Generalized anxiety disorder: Secondary | ICD-10-CM

## 2022-01-15 DIAGNOSIS — F5101 Primary insomnia: Secondary | ICD-10-CM | POA: Diagnosis not present

## 2022-01-15 DIAGNOSIS — R002 Palpitations: Secondary | ICD-10-CM | POA: Diagnosis not present

## 2022-01-15 MED ORDER — ZOLPIDEM TARTRATE 10 MG PO TABS: 10.0000 mg | ORAL_TABLET | Freq: Every evening | ORAL | 3 refills | Status: DC | PRN

## 2022-01-15 NOTE — Progress Notes (Signed)
Established patient visit ? ? ?Patient: Evan Lee   DOB: March 31, 1986   36 y.o. Male  MRN: [id number] ?Visit Date: 01/15/2022 ? ? ?Chief Complaint  ?Patient presents with  ? Follow-up  ? ?Subjective  ?  ?HPI  ?The patient is here for routine follow up.  ?-weaned off of his zoloft since he was last seen. Feels better off of the sertraline than on it now. Headaches are less. Sleeping better  ?-does take ambien at night to help fall asleep. Does work.  ?-has seen allergist. Mostly allergic to grasses, maple trees, and pecans. Was prescribed new medication. Currently takes zyrtec. Plans to switch to claritin.  ?-has pectus excavatum, congenital. Had this evaluated while he was younger. Was told that "everything was fine" and that should not give him trouble. Has developed new symptoms including chest pain at times, palpitations, shortness of breath with exertion, and musculoskeletal pain in chest area when moving his arms. States that chest pain and palpitations is often worse at night and when lying down. These symptoms are not interfering with daily life, however, as they become more frequent and severe, they are worrying him more.  ? ? ?Medications: ?Outpatient Medications Prior to Visit  ?Medication Sig  ? RYALTRIS [id number] MCG/ACT SUSP Place 1-2 sprays into the nose in the morning and at bedtime.  ? [DISCONTINUED] zolpidem (AMBIEN CR) 6.25 MG CR tablet Take 6.25 mg by mouth at bedtime as needed.  ? [DISCONTINUED] acetic acid-hydrocortisone (VOSOL-HC) OTIC solution Place 4 drops into both ears 2 (two) times daily.  ? [DISCONTINUED] sertraline (ZOLOFT) 50 MG tablet Take 1 tablet (50 mg total) by mouth daily.  ? [DISCONTINUED] zolpidem (AMBIEN) 10 MG tablet Take 1 tablet (10 mg total) by mouth at bedtime as needed for sleep.  ? ?No facility-administered medications prior to visit.  ? ? ?Review of Systems  ?Constitutional:  Positive for fatigue. Negative for activity change, chills and fever.  ?HENT:  Negative  for congestion, postnasal drip, rhinorrhea, sinus pressure, sinus pain, sneezing and sore throat.   ?Eyes: Negative.   ?Respiratory:  Positive for chest tightness and shortness of breath. Negative for cough and wheezing.   ?Cardiovascular:  Positive for chest pain and palpitations.  ?Gastrointestinal:  Negative for constipation, diarrhea, nausea and vomiting.  ?Endocrine: Negative for cold intolerance, heat intolerance, polydipsia and polyuria.  ?Genitourinary:  Negative for dysuria, frequency and urgency.  ?Musculoskeletal:  Negative for back pain and myalgias.  ?     Chest tenderness with palpation   ?Skin:  Negative for rash.  ?Allergic/Immunologic: Negative for environmental allergies.  ?Neurological:  Negative for dizziness, weakness and headaches.  ?Psychiatric/Behavioral:  Positive for sleep disturbance. The patient is not nervous/anxious.   ? ? ? ? Objective  ?  ? ?Today's Vitals  ? 01/15/22 1004  ?BP: 127/84  ?Pulse: 81  ?Temp: 98.1 ?F (36.7 ?C)  ?SpO2: 100%  ?Weight: 166 lb 1.9 oz (75.4 kg)  ?Height: 5' 10.87" (1.8 m)  ? ?Body mass index is 23.26 kg/m?.  ? ?BP Readings from Last 3 Encounters:  ?01/15/22 127/84  ?12/13/21 (!) 129/91  ?11/13/21 113/76  ?  ?Wt Readings from Last 3 Encounters:  ?01/15/22 166 lb 1.9 oz (75.4 kg)  ?11/13/21 168 lb 4.8 oz (76.3 kg)  ?09/23/21 169 lb (76.7 kg)  ?  ?Physical Exam ?Vitals and nursing note reviewed.  ?Constitutional:   ?   Appearance: Normal appearance. He is well-developed.  ?HENT:  ?   Head: Normocephalic and  atraumatic.  ?   Nose: Nose normal.  ?   Mouth/Throat:  ?   Mouth: Mucous membranes are moist.  ?   Pharynx: Oropharynx is clear.  ?Eyes:  ?   Extraocular Movements: Extraocular movements intact.  ?   Conjunctiva/sclera: Conjunctivae normal.  ?   Pupils: Pupils are equal, round, and reactive to light.  ?Cardiovascular:  ?   Rate and Rhythm: Normal rate and regular rhythm.  ?   Pulses: Normal pulses.  ?   Heart sounds: Normal heart sounds.  ?Pulmonary:  ?    Effort: Pulmonary effort is normal.  ?   Breath sounds: Normal breath sounds.  ?Chest:  ?   Chest wall: Deformity and tenderness present.  ? ? ?Abdominal:  ?   Palpations: Abdomen is soft.  ?Musculoskeletal:     ?   General: Normal range of motion.  ?   Cervical back: Normal range of motion and neck supple.  ?Lymphadenopathy:  ?   Cervical: No cervical adenopathy.  ?Skin: ?   General: Skin is warm and dry.  ?   Capillary Refill: Capillary refill takes less than 2 seconds.  ?Neurological:  ?   General: No focal deficit present.  ?   Mental Status: He is alert and oriented to person, place, and time.  ?Psychiatric:     ?   Mood and Affect: Mood normal.     ?   Behavior: Behavior normal.     ?   Thought Content: Thought content normal.     ?   Judgment: Judgment normal.  ?  ? ? Assessment & Plan  ?  ?1. Congenital pectus excavatum ?Starting to cause symptoms for patient. Will get chest CT for further evaluation.  ?- CT Chest W Contrast; Future ? ?2. Chest wall tenderness ?Mostly along left border. Will get chest CT for further evaluation.  ?- CT Chest W Contrast; Future ? ?3. Palpitations ?This is likely due to pectus excavatum deformity. Chest CT ordered for further evaluation. Will get Ecg at next visit and refer to cardiology if indicated  ?- CT Chest W Contrast; Future ? ?4. Primary insomnia ?Renew zolpidem '10mg'$ . May take at bedtime as needed.  ?- zolpidem (AMBIEN) 10 MG tablet; Take 1 tablet (10 mg total) by mouth at bedtime as needed for sleep.  Dispense: 30 tablet; Refill: 3 ? ?5. Generalized anxiety disorder ?Improved and patient is doing well without zoloft. Has weaned himself off this medication. Will continue to monitor.  ?  ?Problem List Items Addressed This Visit   ? ?  ? Musculoskeletal and Integument  ? Congenital pectus excavatum - Primary  ? Relevant Orders  ? CT Chest W Contrast  ?  ? Other  ? Generalized anxiety disorder  ? Primary insomnia  ? Relevant Medications  ? zolpidem (AMBIEN) 10 MG tablet  ?  Chest wall tenderness  ? Relevant Orders  ? CT Chest W Contrast  ? Palpitations  ? Relevant Orders  ? CT Chest W Contrast  ?  ? ?Return in about 4 weeks (around 02/12/2022) for needs ECG - review Chest CT .  ?   ? ? ? ? ?Ronnell Freshwater, NP  ?[facility] ?[phone] (phone) ?[phone] (fax) ? ?[facility]  ?

## 2022-01-22 ENCOUNTER — Ambulatory Visit: Payer: BC Managed Care – PPO | Admitting: Allergy

## 2022-01-23 ENCOUNTER — Ambulatory Visit
Admission: RE | Admit: 2022-01-23 | Discharge: 2022-01-23 | Disposition: A | Discharge status: Home / Self Care | Payer: BC Managed Care – PPO | Source: Ambulatory Visit | Attending: Nurse Practitioner | Admitting: Nurse Practitioner

## 2022-01-23 DIAGNOSIS — Q676 Pectus excavatum: Secondary | ICD-10-CM

## 2022-01-23 DIAGNOSIS — R0789 Other chest pain: Secondary | ICD-10-CM

## 2022-01-23 DIAGNOSIS — R002 Palpitations: Secondary | ICD-10-CM

## 2022-01-23 MED ORDER — IOPAMIDOL (ISOVUE-300) INJECTION 61%
75.0000 mL | Freq: Once | INTRAVENOUS | Status: AC | PRN
  Administered 2022-01-23: 75 mL via INTRAVENOUS

## 2022-01-30 ENCOUNTER — Ambulatory Visit: Payer: BC Managed Care – PPO | Admitting: Nurse Practitioner

## 2022-01-30 NOTE — Progress Notes (Unsigned)
Established patient visit   Patient: Evan Lee   DOB: [birth year]   36 y.o. Male  MRN: [id number] Visit Date: 01/30/2022   No chief complaint on file.  Subjective    HPI  Follow up visit.  -developed new symptoms including chest pain at times, palpitations, shortness of breath with exertion, and musculoskeletal pain in chest area when moving his arms. States that chest pain and palpitations is often worse at night and when lying down. These symptoms are not interfering with daily life, however, as they become more frequent and severe, they are worrying him more.  CT scan of chest with contrast did show mild congenital pectus excavatum    Medications: Outpatient Medications Prior to Visit  Medication Sig   RYALTRIS 665-25 MCG/ACT SUSP Place 1-2 sprays into the nose in the morning and at bedtime.   zolpidem (AMBIEN) 10 MG tablet Take 1 tablet (10 mg total) by mouth at bedtime as needed for sleep.   No facility-administered medications prior to visit.    Review of Systems  {Labs (Optional):23779}   Objective    There were no vitals taken for this visit. BP Readings from Last 3 Encounters:  01/15/22 127/84  12/13/21 (!) 129/91  11/13/21 113/76    Wt Readings from Last 3 Encounters:  01/15/22 166 lb 1.9 oz (75.4 kg)  11/13/21 168 lb 4.8 oz (76.3 kg)  09/23/21 169 lb (76.7 kg)    Physical Exam  ***  No results found for any visits on 01/30/22.  Assessment & Plan     Problem List Items Addressed This Visit   None    No follow-ups on file.         Ronnell Freshwater, NP  [facility] [phone] (phone) [phone] (fax)  [place]

## 2022-02-11 ENCOUNTER — Ambulatory Visit: Payer: BC Managed Care – PPO | Admitting: Nurse Practitioner

## 2022-02-11 ENCOUNTER — Encounter: Payer: Self-pay | Admitting: Nurse Practitioner

## 2022-02-11 VITALS — BP 118/74 | HR 87 | Temp 97.8°F | Ht 70.87 in | Wt 166.9 lb

## 2022-02-11 DIAGNOSIS — Q676 Pectus excavatum: Secondary | ICD-10-CM | POA: Diagnosis not present

## 2022-02-11 DIAGNOSIS — R41840 Attention and concentration deficit: Secondary | ICD-10-CM | POA: Diagnosis not present

## 2022-02-11 DIAGNOSIS — F411 Generalized anxiety disorder: Secondary | ICD-10-CM | POA: Diagnosis not present

## 2022-02-11 DIAGNOSIS — R0789 Other chest pain: Secondary | ICD-10-CM | POA: Diagnosis not present

## 2022-02-11 MED ORDER — BUPROPION HCL ER (XL) 150 MG PO TB24: 150.0000 mg | ORAL_TABLET | Freq: Every day | ORAL | 2 refills | Status: DC

## 2022-02-11 NOTE — Progress Notes (Signed)
Established patient visit   Patient: Evan Lee   DOB: 21-Sep-1985   36 y.o. Male  MRN: [id number] Visit Date: 02/11/2022   Chief Complaint  Patient presents with   Follow-up   Subjective    HPI  Patient presenting for follow up visit.  -CT chest done for further evaluation of pectus excavatum deformity.  Mild pectus excavatum deformity was noted with no other abnormalities.  -had been experiencing palpitations and chest discomfort with certain activities. He states that these activities tend to be very strenuous. He states that the extent of the deformity had been discussed when he was very young child, but not again as an adult.  -doing well on ambien '10mg'$ . States that he has very little to no negative side effects. He is able to wake up with kids if they get up and need parents. He can then go back to sleep. He is able to wake up in the morning without fatigue or sluggishness.  -has been off zoloft for a few months. States that he has increased energy but has noted that anxiety has started to restart. Has inner "angst." Finds th. t it is difficult to focus and stay on track with certain tasks.    Medications: Outpatient Medications Prior to Visit  Medication Sig   zolpidem (AMBIEN) 10 MG tablet Take 1 tablet (10 mg total) by mouth at bedtime as needed for sleep.   [DISCONTINUED] RYALTRIS [id number] MCG/ACT SUSP Place 1-2 sprays into the nose in the morning and at bedtime.   No facility-administered medications prior to visit.    Review of Systems  Constitutional:  Negative for activity change, chills, fatigue and fever.  HENT:  Negative for congestion, postnasal drip, rhinorrhea, sinus pressure, sinus pain, sneezing and sore throat.   Eyes: Negative.   Respiratory:  Negative for cough, chest tightness, shortness of breath and wheezing.   Cardiovascular:  Positive for palpitations. Negative for chest pain.  Gastrointestinal:  Negative for constipation, diarrhea, nausea  and vomiting.  Endocrine: Negative for cold intolerance, heat intolerance, polydipsia and polyuria.  Genitourinary:  Negative for dysuria, frequency and urgency.  Musculoskeletal:  Negative for back pain and myalgias.       Chest tenderness with palpation   Skin:  Negative for rash.  Allergic/Immunologic: Negative for environmental allergies.  Neurological:  Negative for dizziness, weakness and headaches.  Psychiatric/Behavioral:  Positive for sleep disturbance. The patient is not nervous/anxious.        Objective     Today's Vitals   02/11/22 0838  BP: 118/74  Pulse: 87  Temp: 97.8 F (36.6 C)  SpO2: 99%  Weight: 166 lb 14.4 oz (75.7 kg)  Height: 5' 10.87" (1.8 m)   Body mass index is 23.37 kg/m.   BP Readings from Last 3 Encounters:  02/11/22 118/74  01/15/22 127/84  12/13/21 (!) 129/91    Wt Readings from Last 3 Encounters:  02/11/22 166 lb 14.4 oz (75.7 kg)  01/15/22 166 lb 1.9 oz (75.4 kg)  11/13/21 168 lb 4.8 oz (76.3 kg)    Physical Exam Vitals and nursing note reviewed.  Constitutional:      Appearance: Normal appearance. He is well-developed.  HENT:     Head: Normocephalic and atraumatic.     Nose: Nose normal.     Mouth/Throat:     Mouth: Mucous membranes are moist.     Pharynx: Oropharynx is clear.  Eyes:     Extraocular Movements: Extraocular movements intact.  Conjunctiva/sclera: Conjunctivae normal.     Pupils: Pupils are equal, round, and reactive to light.  Cardiovascular:     Rate and Rhythm: Normal rate and regular rhythm.     Pulses: Normal pulses.     Heart sounds: Normal heart sounds.  Pulmonary:     Effort: Pulmonary effort is normal.     Breath sounds: Normal breath sounds.  Chest:     Chest wall: Deformity and tenderness present.    Abdominal:     Palpations: Abdomen is soft.  Musculoskeletal:        General: Normal range of motion.     Cervical back: Normal range of motion and neck supple.  Lymphadenopathy:      Cervical: No cervical adenopathy.  Skin:    General: Skin is warm and dry.     Capillary Refill: Capillary refill takes less than 2 seconds.  Neurological:     General: No focal deficit present.     Mental Status: He is alert and oriented to person, place, and time.  Psychiatric:        Mood and Affect: Mood normal.        Behavior: Behavior normal.        Thought Content: Thought content normal.        Judgment: Judgment normal.      Assessment & Plan    1. Congenital pectus excavatum Reviewed CT scan results with patient.  It showed Mild pectus excavatum deformity was noted with no other abnormalities.  We will reassess as indicated if new symptoms develop.  2. Chest wall tenderness Reviewed CT scan results with patient.  It showed Mild pectus excavatum deformity was noted with no other abnormalities.  We will reassess as indicated if new symptoms develop.  3. Generalized anxiety disorder Patient no longer taking Zoloft.  Reports improved anxiety/depression symptoms, but still feels lack of focus and concentration.  Trial Wellbutrin XL 150 mg daily. Reassess in 2 months.  - buPROPion (WELLBUTRIN XL) 150 MG 24 hr tablet; Take 1 tablet (150 mg total) by mouth daily.  Dispense: 30 tablet; Refill: 2  4. Attention and concentration deficit Patient no longer taking Zoloft.  Reports improved anxiety/depression symptoms, but still feels lack of focus and concentration.  Trial Wellbutrin XL 150 mg daily. Reassess in 2 months.  - buPROPion (WELLBUTRIN XL) 150 MG 24 hr tablet; Take 1 tablet (150 mg total) by mouth daily.  Dispense: 30 tablet; Refill: 2    Problem List Items Addressed This Visit       Musculoskeletal and Integument   Congenital pectus excavatum - Primary     Other   Generalized anxiety disorder   Relevant Medications   buPROPion (WELLBUTRIN XL) 150 MG 24 hr tablet   Chest wall tenderness   Attention and concentration deficit   Relevant Medications   buPROPion  (WELLBUTRIN XL) 150 MG 24 hr tablet     Return in about 2 months (around 04/13/2022) for mood.         Ronnell Freshwater, NP  [facility] at [facility] [phone]) [phone] (fax)  [place]

## 2022-02-16 NOTE — Progress Notes (Signed)
Reviewed with patient during visit. All questions answered.

## 2022-02-17 DIAGNOSIS — R41840 Attention and concentration deficit: Secondary | ICD-10-CM | POA: Insufficient documentation

## 2022-02-18 ENCOUNTER — Ambulatory Visit: Payer: BC Managed Care – PPO | Admitting: Nurse Practitioner

## 2022-03-05 ENCOUNTER — Other Ambulatory Visit: Payer: Self-pay | Admitting: Nurse Practitioner

## 2022-03-05 DIAGNOSIS — F411 Generalized anxiety disorder: Secondary | ICD-10-CM

## 2022-03-05 DIAGNOSIS — R41840 Attention and concentration deficit: Secondary | ICD-10-CM

## 2022-03-25 ENCOUNTER — Encounter: Payer: Self-pay | Admitting: Nurse Practitioner

## 2022-03-29 ENCOUNTER — Ambulatory Visit: Payer: BC Managed Care – PPO | Admitting: Podiatry

## 2022-04-08 ENCOUNTER — Other Ambulatory Visit: Payer: Self-pay | Admitting: Nurse Practitioner

## 2022-04-08 DIAGNOSIS — F411 Generalized anxiety disorder: Secondary | ICD-10-CM

## 2022-04-08 DIAGNOSIS — R41840 Attention and concentration deficit: Secondary | ICD-10-CM

## 2022-04-17 ENCOUNTER — Ambulatory Visit: Payer: BC Managed Care – PPO | Admitting: Podiatry

## 2022-04-22 ENCOUNTER — Other Ambulatory Visit: Payer: Self-pay | Admitting: Nurse Practitioner

## 2022-04-22 DIAGNOSIS — F411 Generalized anxiety disorder: Secondary | ICD-10-CM

## 2022-04-22 DIAGNOSIS — R41840 Attention and concentration deficit: Secondary | ICD-10-CM

## 2022-05-18 NOTE — Progress Notes (Signed)
Established patient visit   Patient: Evan Lee   DOB: Apr 02, 1986   36 y.o. Male  MRN: [id number] Visit Date: 05/19/2022   Chief Complaint  Patient presents with   Depression   Subjective    HPI  Follow up depression and possible focus deficit -trial Wellbutrin XL 150 mg daily ich hs has sttopped taking.  -zoloft worked better.  -as time goes on, he feels like the bigger problem is depression.  -has gone back to feeling unmotivated. Does not feel as happy as he thinks he should  -has lost interest in activities he used to enjoy.  -feels like he has lost ambition.     Medications: Outpatient Medications Prior to Visit  Medication Sig   [DISCONTINUED] zolpidem (AMBIEN) 10 MG tablet Take 1 tablet (10 mg total) by mouth at bedtime as needed for sleep.   [DISCONTINUED] buPROPion (WELLBUTRIN XL) 150 MG 24 hr tablet TAKE 1 TABLET BY MOUTH EVERY DAY   No facility-administered medications prior to visit.    Review of Systems  Constitutional:  Positive for fatigue. Negative for activity change, chills and fever.  HENT:  Negative for congestion, postnasal drip, rhinorrhea, sinus pressure, sinus pain, sneezing and sore throat.   Eyes: Negative.   Respiratory:  Negative for cough, shortness of breath and wheezing.   Cardiovascular:  Negative for chest pain and palpitations.  Gastrointestinal:  Negative for constipation, diarrhea, nausea and vomiting.  Endocrine: Negative for cold intolerance, heat intolerance, polydipsia and polyuria.  Genitourinary:  Negative for dysuria, frequency and urgency.  Musculoskeletal:  Negative for back pain and myalgias.  Skin:  Negative for rash.  Allergic/Immunologic: Negative for environmental allergies.  Neurological:  Negative for dizziness, weakness and headaches.  Psychiatric/Behavioral:  Positive for dysphoric mood and sleep disturbance. The patient is nervous/anxious.        Objective     Today's Vitals   05/19/22 1540  BP:  121/73  Pulse: 84  SpO2: 99%  Weight: 161 lb 6.4 oz (73.2 kg)  Height: 5' 10.87" (1.8 m)   Body mass index is 22.59 kg/m.   BP Readings from Last 3 Encounters:  05/19/22 121/73  02/11/22 118/74  01/15/22 127/84    Wt Readings from Last 3 Encounters:  05/19/22 161 lb 6.4 oz (73.2 kg)  02/11/22 166 lb 14.4 oz (75.7 kg)  01/15/22 166 lb 1.9 oz (75.4 kg)    Physical Exam Vitals and nursing note reviewed.  Constitutional:      Appearance: Normal appearance. He is well-developed.  HENT:     Head: Normocephalic and atraumatic.  Eyes:     Pupils: Pupils are equal, round, and reactive to light.  Cardiovascular:     Rate and Rhythm: Normal rate and regular rhythm.     Pulses: Normal pulses.     Heart sounds: Normal heart sounds.  Pulmonary:     Effort: Pulmonary effort is normal.     Breath sounds: Normal breath sounds.  Abdominal:     Palpations: Abdomen is soft.  Musculoskeletal:        General: Normal range of motion.     Cervical back: Normal range of motion and neck supple.  Lymphadenopathy:     Cervical: No cervical adenopathy.  Skin:    General: Skin is warm and dry.     Capillary Refill: Capillary refill takes less than 2 seconds.  Neurological:     General: No focal deficit present.     Mental Status: He is alert and  oriented to person, place, and time.  Psychiatric:        Mood and Affect: Mood normal.        Behavior: Behavior normal.        Thought Content: Thought content normal.        Judgment: Judgment normal.       Assessment & Plan    1. Episode of moderate major depression (HCC) Trial of Prozac 20 mg daily.  Reassess in approximately 3 months. - FLUoxetine (PROZAC) 20 MG capsule; Take 1 capsule (20 mg total) by mouth daily.  Dispense: 30 capsule; Refill: 2  2. Primary insomnia May continue Ambien 10 mg at bedtime as needed. - zolpidem (AMBIEN) 10 MG tablet; Take 1 tablet (10 mg total) by mouth at bedtime as needed for sleep.  Dispense: 30  tablet; Refill: 2   Problem List Items Addressed This Visit       Other   Primary insomnia   Relevant Medications   zolpidem (AMBIEN) 10 MG tablet   Episode of moderate major depression ([place]) - Primary   Relevant Medications   FLUoxetine (PROZAC) 20 MG capsule     Return in about 3 months (around 08/18/2022) for mood.         Ronnell Freshwater, NP  [facility] Primary Care at [facility] [phone] (phone) [phone] (fax)  [place]

## 2022-05-19 ENCOUNTER — Encounter: Payer: Self-pay | Admitting: Nurse Practitioner

## 2022-05-19 ENCOUNTER — Ambulatory Visit: Payer: BC Managed Care – PPO | Admitting: Nurse Practitioner

## 2022-05-19 VITALS — BP 121/73 | HR 84 | Ht 70.87 in | Wt 161.4 lb

## 2022-05-19 DIAGNOSIS — F321 Major depressive disorder, single episode, moderate: Secondary | ICD-10-CM

## 2022-05-19 DIAGNOSIS — F5101 Primary insomnia: Secondary | ICD-10-CM

## 2022-05-19 MED ORDER — FLUOXETINE HCL 20 MG PO CAPS: 20.0000 mg | ORAL_CAPSULE | Freq: Every day | ORAL | 2 refills | Status: DC

## 2022-05-19 MED ORDER — ZOLPIDEM TARTRATE 10 MG PO TABS: 10.0000 mg | ORAL_TABLET | Freq: Every evening | ORAL | 2 refills | Status: DC | PRN

## 2022-06-01 DIAGNOSIS — F321 Major depressive disorder, single episode, moderate: Secondary | ICD-10-CM | POA: Insufficient documentation

## 2022-06-12 ENCOUNTER — Other Ambulatory Visit: Payer: Self-pay | Admitting: Nurse Practitioner

## 2022-06-12 ENCOUNTER — Encounter: Payer: Self-pay | Admitting: Nurse Practitioner

## 2022-06-12 DIAGNOSIS — F321 Major depressive disorder, single episode, moderate: Secondary | ICD-10-CM

## 2022-06-12 MED ORDER — FLUOXETINE HCL 40 MG PO CAPS: 40.0000 mg | ORAL_CAPSULE | Freq: Every day | ORAL | 2 refills | Status: DC

## 2022-06-12 NOTE — Progress Notes (Signed)
Increased dose fluoxetine to 40 mg and sent new rx to [facility] [address].

## 2022-07-13 ENCOUNTER — Other Ambulatory Visit: Payer: Self-pay | Admitting: Nurse Practitioner

## 2022-07-13 DIAGNOSIS — F321 Major depressive disorder, single episode, moderate: Secondary | ICD-10-CM

## 2022-07-20 IMAGING — MR MR FOOT*R* W/O CM
4 of 7 series · 26 of 40 positions shown · non-contrast
Comparison: Radiographs 05/24/2020

CLINICAL DATA: Lateral foot pain.

EXAM:
MRI OF THE RIGHT FOREFOOT WITHOUT CONTRAST
TECHNIQUE: Multiplanar, multisequence MR imaging of the ankle was performed. No
intravenous contrast was administered.

[Series 3: T1 · axial · 3.0mm · 0.49mm/px · z∈[-45,+104]mm · 7 of 44 slices shown]
[im 1/44]
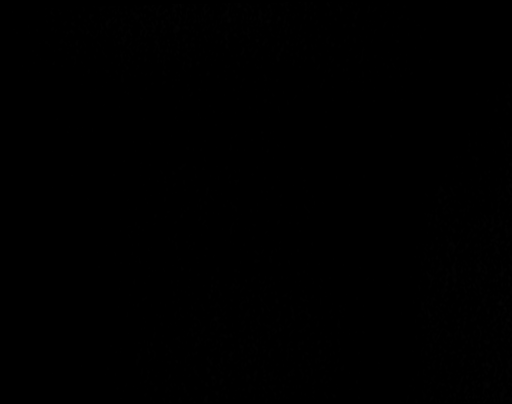
[im 8/44]
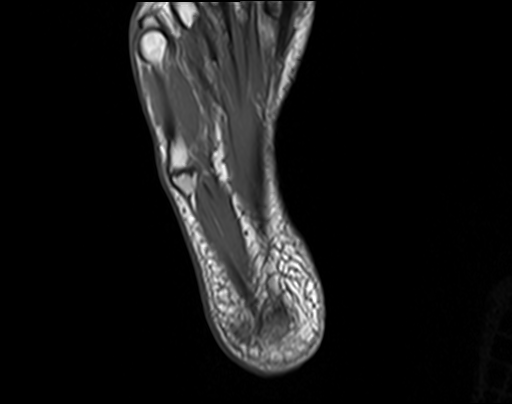
[im 15/44]
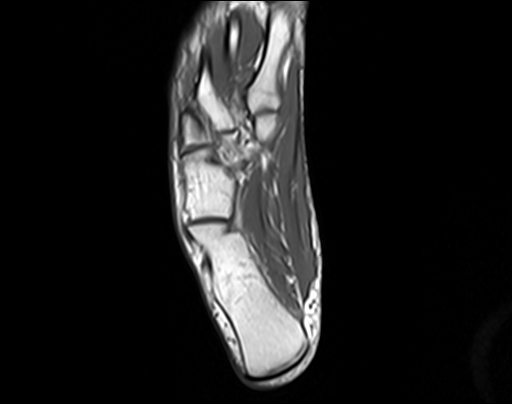
[im 22/44]
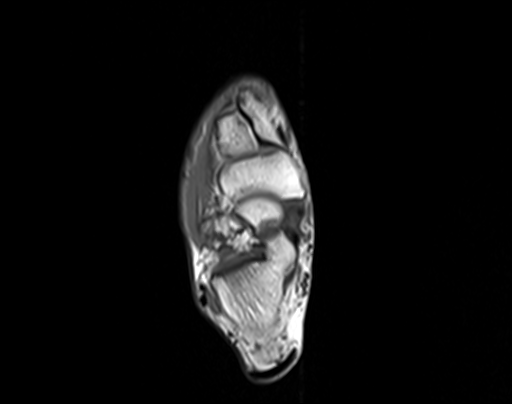
[im 29/44]
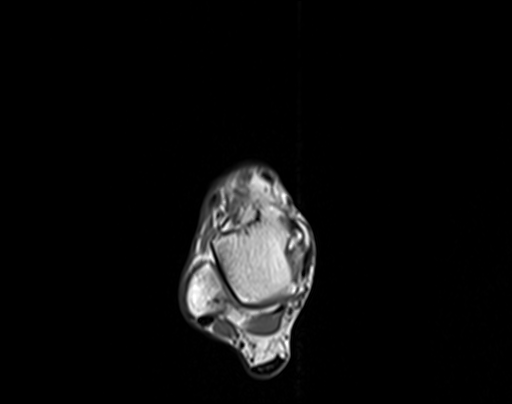
[im 36/44]
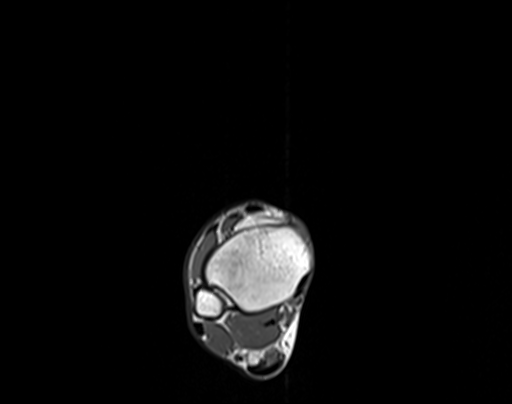
[im 44/44]
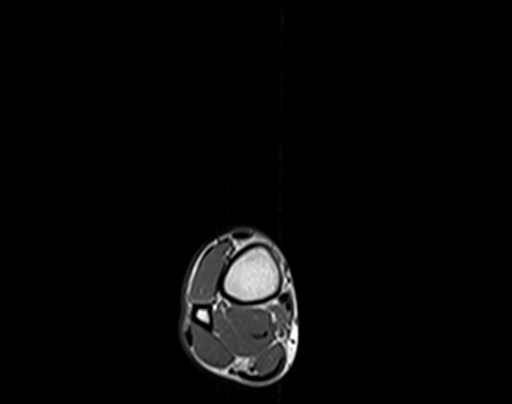

[Series 4: T2 fat-sat · axial · 3.0mm · 0.49mm/px · z∈[-51,+110]mm · 7 of 44 slices shown (1 of 2)]
[im 1/44]
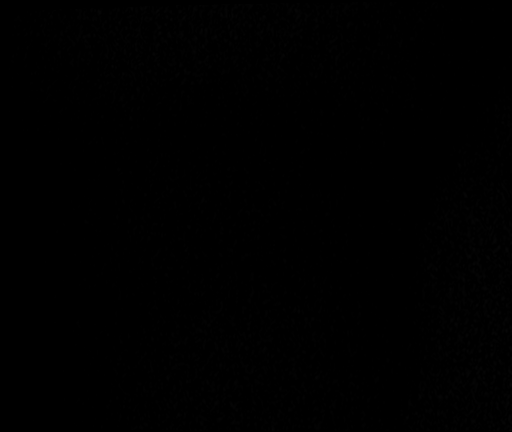
[im 8/44]
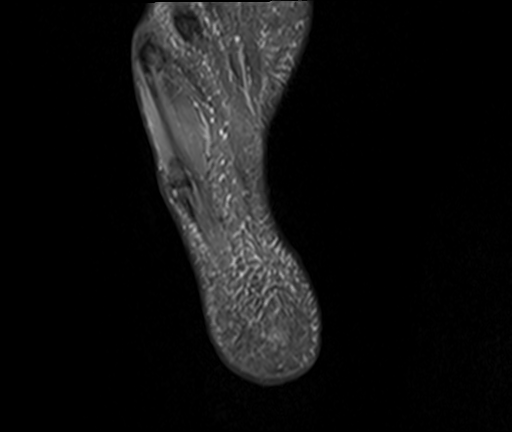
[im 15/44]
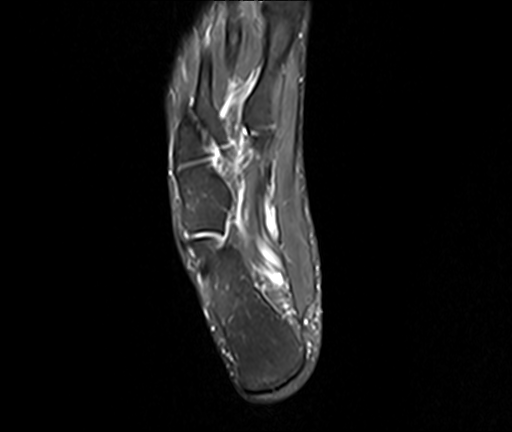
[im 22/44]
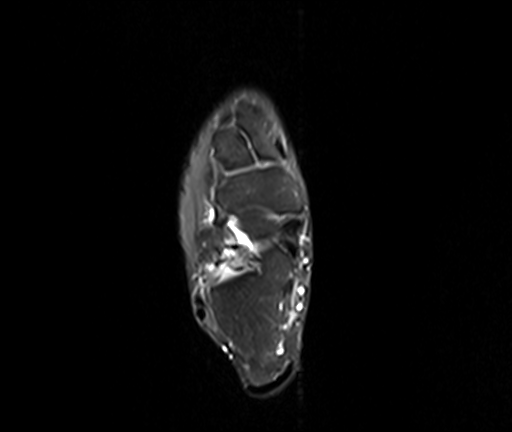
[im 29/44]
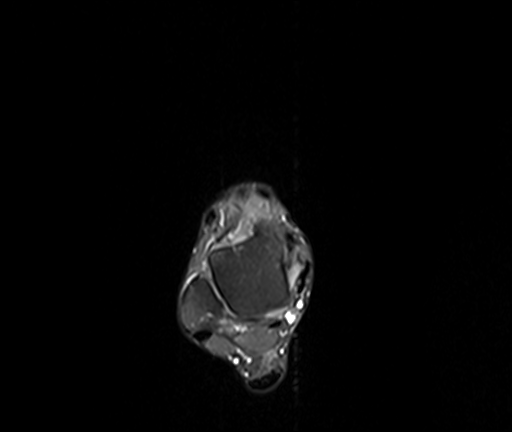
[im 36/44]
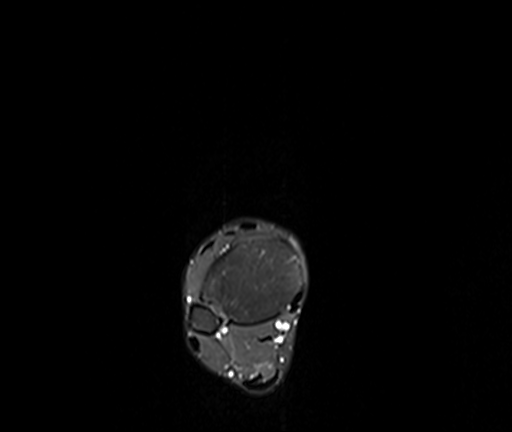
[im 44/44]
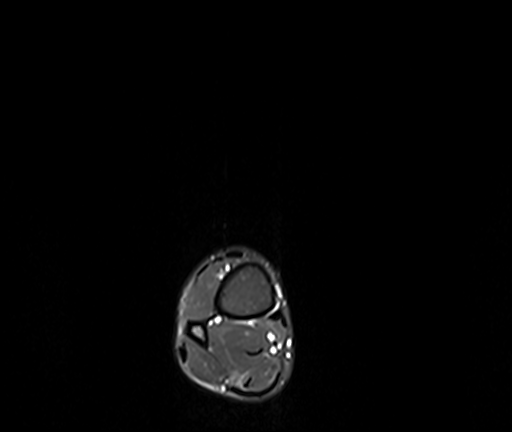

[Series 5: T2 fat-sat · coronal · 3.0mm · 0.39mm/px · 6 of 45 slices shown (2 of 2)]
[im 1/45]
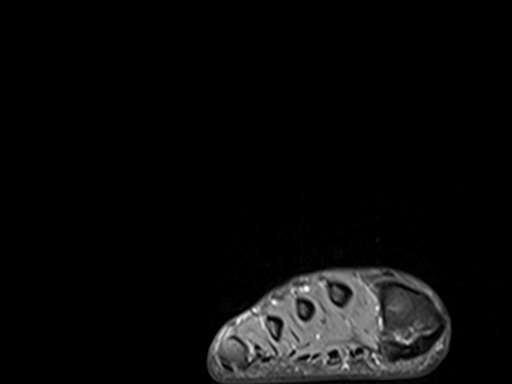
[im 9/45]
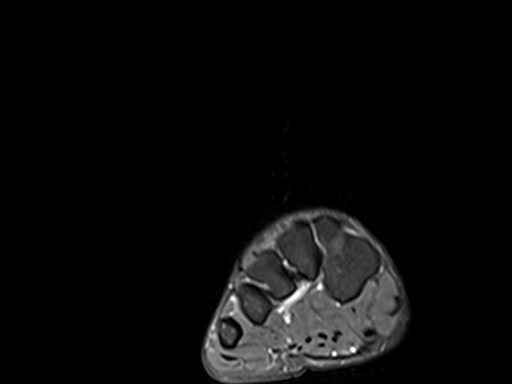
[im 18/45]
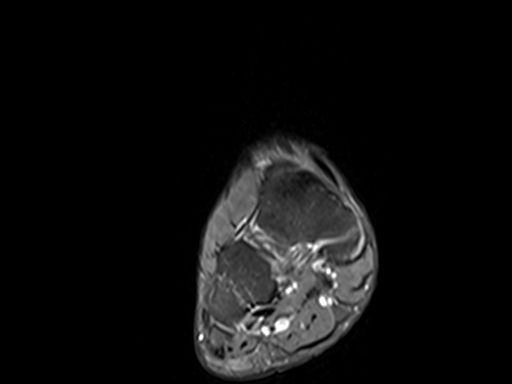
[im 27/45]
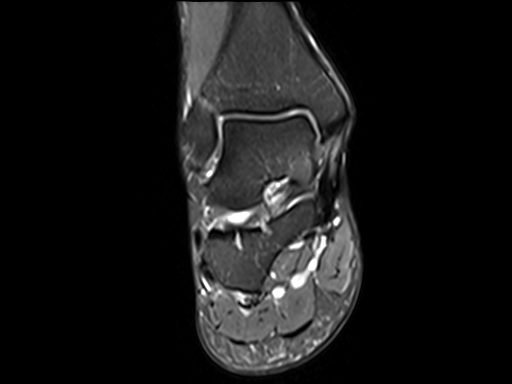
[im 36/45]
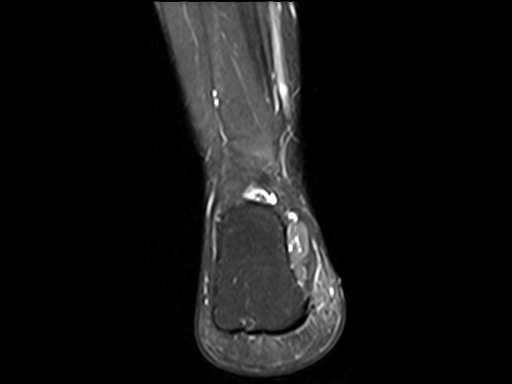
[im 45/45]
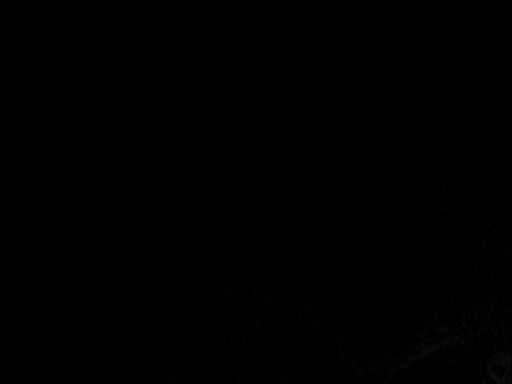

[Series 8: PD fat-sat · axial · 3.0mm · 0.62mm/px · z∈[-53,+114]mm · 6 of 44 slices shown]
[im 1/44]
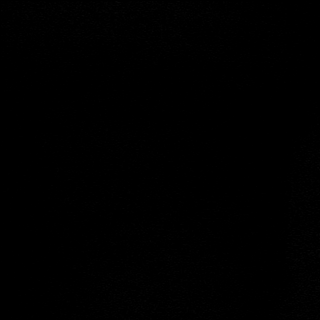
[im 9/44]
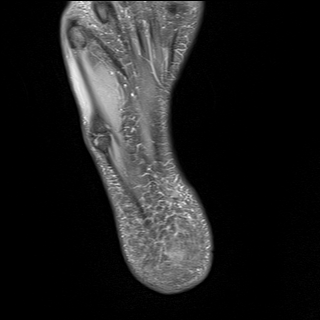
[im 18/44]
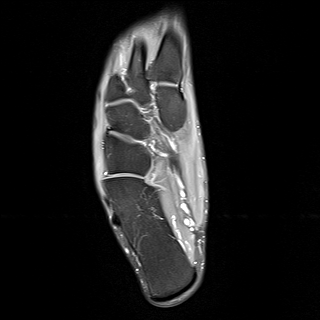
[im 26/44]
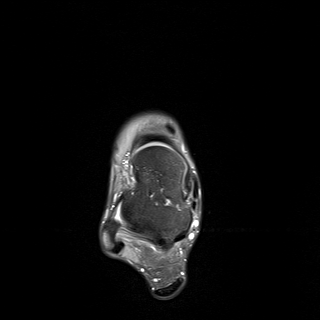
[im 35/44]
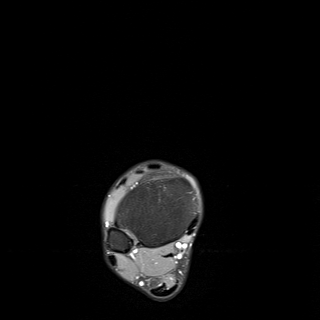
[im 44/44]
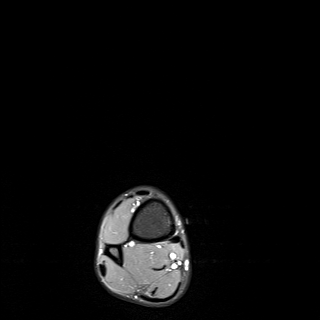

[26 of 40 positions shown; findings below may reference images not displayed]

FINDINGS: TENDONS

Peroneal: Intact

Posteromedial: Mild tendinopathy involving the posterior tibialis
tendon but no complete tear/rupture.

Anterior: Intact.  No significant tendinopathy or tenosynovitis.

Achilles: Normal

Plantar Fascia: Intact

LIGAMENTS

Lateral: Intact

Medial: Intact

CARTILAGE

Ankle Joint: The articular cartilage is intact. No cartilage defects
or osteochondral lesion. No joint effusion.

Subtalar Joints/Sinus Tarsi: Mild subtalar joint degenerative
changes. Sinus tarsi contains a small amount of fluid but the
cervical and interosseous ligaments are intact and the spring
ligament is intact.

Bones: There is a remote ununited transverse fracture through the
base of the fifth metatarsal. Mild reactive marrow edema and cystic
type changes are noted. The could be some type of fibrous union with
a synchondrosis. No acute fracture or worrisome bone lesions.

Other: Unremarkable foot and ankle musculature.
IMPRESSION: 1. Remote ununited transverse fracture through the base of the fifth
metatarsal. Edema like signal changes and cystic changes on both
sides of the fracture could suggest a fibrous union and
synchondrosis.
2. Mild tendinopathy involving the posterior tibialis tendon but no
complete tear/rupture.
3. Intact medial and lateral ankle ligaments and tendons.

## 2022-08-17 NOTE — Progress Notes (Deleted)
Established patient visit   Patient: Evan Lee   DOB: 03/07/1986   36 y.o. Male  MRN: [id number] Visit Date: 08/18/2022   No chief complaint on file.  Subjective    HPI  Follow up depression -prozac 40 mg daily -zolpidem 10 mg at bedtime as needed.    Medications: Outpatient Medications Prior to Visit  Medication Sig   FLUoxetine (PROZAC) 40 MG capsule TAKE 1 CAPSULE BY MOUTH DAILY.   zolpidem (AMBIEN) 10 MG tablet Take 1 tablet (10 mg total) by mouth at bedtime as needed for sleep.   No facility-administered medications prior to visit.    Review of Systems  {Labs (Optional):23779}   Objective    There were no vitals filed for this visit. There is no height or weight on file to calculate BMI.  BP Readings from Last 3 Encounters:  05/19/22 121/73  02/11/22 118/74  01/15/22 127/84    Wt Readings from Last 3 Encounters:  05/19/22 161 lb 6.4 oz (73.2 kg)  02/11/22 166 lb 14.4 oz (75.7 kg)  01/15/22 166 lb 1.9 oz (75.4 kg)    Physical Exam  ***  No results found for any visits on 08/18/22.  Assessment & Plan     Problem List Items Addressed This Visit   None    No follow-ups on file.         Ronnell Freshwater, NP  [facility] Primary Care at [facility] [phone] (phone) [phone] (fax)  [place]

## 2022-08-18 ENCOUNTER — Ambulatory Visit: Payer: BC Managed Care – PPO | Admitting: Nurse Practitioner

## 2022-08-22 ENCOUNTER — Other Ambulatory Visit: Payer: Self-pay | Admitting: Nurse Practitioner

## 2022-08-22 ENCOUNTER — Telehealth: Payer: Self-pay

## 2022-08-22 DIAGNOSIS — F5101 Primary insomnia: Secondary | ICD-10-CM

## 2022-08-22 MED ORDER — ZOLPIDEM TARTRATE 10 MG PO TABS: 10.0000 mg | ORAL_TABLET | Freq: Every evening | ORAL | 1 refills | Status: DC | PRN

## 2022-08-22 NOTE — Telephone Encounter (Signed)
Pt is requesting a short fill to get him to his appointment of 09/30/22.  zolpidem (AMBIEN) 10 MG tablet   [id number] [address]   LOV 05/19/22 ROV 09/30/22

## 2022-08-22 NOTE — Telephone Encounter (Signed)
I sent this for the patient.

## 2022-09-30 ENCOUNTER — Other Ambulatory Visit: Payer: Self-pay | Admitting: Nurse Practitioner

## 2022-09-30 ENCOUNTER — Encounter: Payer: Self-pay | Admitting: Nurse Practitioner

## 2022-09-30 ENCOUNTER — Ambulatory Visit: Payer: BC Managed Care – PPO | Admitting: Nurse Practitioner

## 2022-09-30 VITALS — BP 127/77 | HR 83 | Ht 70.87 in | Wt 166.8 lb

## 2022-09-30 DIAGNOSIS — F5101 Primary insomnia: Secondary | ICD-10-CM

## 2022-09-30 DIAGNOSIS — R5383 Other fatigue: Secondary | ICD-10-CM

## 2022-09-30 DIAGNOSIS — F321 Major depressive disorder, single episode, moderate: Secondary | ICD-10-CM

## 2022-09-30 MED ORDER — FLUOXETINE HCL 10 MG PO CAPS: 10.0000 mg | ORAL_CAPSULE | Freq: Every day | ORAL | 1 refills | Status: DC

## 2022-09-30 MED ORDER — ZOLPIDEM TARTRATE 10 MG PO TABS: 10.0000 mg | ORAL_TABLET | Freq: Every evening | ORAL | 2 refills | Status: DC | PRN

## 2022-09-30 NOTE — Progress Notes (Signed)
Established patient visit   Patient: Evan Lee   DOB: May 28, 1986   37 y.o. Male  MRN: [id number] Visit Date: 09/30/2022   Chief Complaint  Patient presents with   Follow-up   Subjective    HPI  Follow up  -depression  --started prozac 20 mg daily  --taking ambien 10 mg at bedtime to help with sleep  --wants to step down prozac dosing if possible.  -has made initial patient appointment with [facility] in near future.  -having significant fatigue.  -difficulty prioritizing tasks and getting things done.  Has been over a year since he has had labs drawn.  -father recently started taking testosterone and has had low testosterone since he was about 39 .   Medications: Outpatient Medications Prior to Visit  Medication Sig   [DISCONTINUED] FLUoxetine (PROZAC) 40 MG capsule TAKE 1 CAPSULE BY MOUTH DAILY.   [DISCONTINUED] zolpidem (AMBIEN) 10 MG tablet Take 1 tablet (10 mg total) by mouth at bedtime as needed for sleep.   No facility-administered medications prior to visit.    Review of Systems  Constitutional:  Positive for fatigue. Negative for activity change, chills and fever.  HENT:  Negative for congestion, postnasal drip, rhinorrhea, sinus pressure, sinus pain, sneezing and sore throat.   Eyes: Negative.   Respiratory:  Negative for cough, shortness of breath and wheezing.   Cardiovascular:  Negative for chest pain and palpitations.  Gastrointestinal:  Negative for constipation, diarrhea, nausea and vomiting.  Endocrine: Negative for cold intolerance, heat intolerance, polydipsia and polyuria.  Genitourinary:  Negative for dysuria, frequency and urgency.  Musculoskeletal:  Negative for back pain and myalgias.  Skin:  Negative for rash.  Allergic/Immunologic: Negative for environmental allergies.  Neurological:  Negative for dizziness and weakness.  Psychiatric/Behavioral:  Positive for decreased concentration, dysphoric mood and sleep  disturbance. The patient is nervous/anxious.     Last CBC Lab Results  Component Value Date   WBC 6.4 10/07/2022   HGB 13.8 10/07/2022   HCT 40.8 10/07/2022   MCV 87 10/07/2022   MCH 29.3 10/07/2022   RDW 12.0 10/07/2022   PLT 274 [id number]   Last metabolic panel Lab Results  Component Value Date   GLUCOSE 98 10/07/2022   NA 141 10/07/2022   K 4.8 10/07/2022   CL 105 10/07/2022   CO2 24 10/07/2022   BUN 17 10/07/2022   CREATININE 0.96 10/07/2022   EGFR 104 10/07/2022   CALCIUM 9.2 10/07/2022   PHOS 3.9 03/31/2016   PROT 6.7 10/07/2022   ALBUMIN 4.7 10/07/2022   LABGLOB 2.0 10/07/2022   AGRATIO 2.4 (H) 10/07/2022   BILITOT 0.3 10/07/2022   ALKPHOS 74 10/07/2022   AST 16 10/07/2022   ALT 10 10/07/2022   Last lipids Lab Results  Component Value Date   CHOL 198 10/07/2022   HDL 55 10/07/2022   LDLCALC 132 (H) 10/07/2022   TRIG 58 10/07/2022   CHOLHDL 3.6 10/07/2022   Last hemoglobin A1c Lab Results  Component Value Date   HGBA1C 5.2 10/07/2022   Last thyroid functions Lab Results  Component Value Date   TSH 1.770 10/07/2022       Objective     Today's Vitals   09/30/22 1435  BP: 127/77  Pulse: 83  SpO2: 97%  Weight: 166 lb 12.8 oz (75.7 kg)  Height: 5' 10.87" (1.8 m)   Body mass index is 23.35 kg/m.  BP Readings from Last 3 Encounters:  09/30/22 127/77  05/19/22 121/73  02/11/22 118/74    Wt Readings from Last 3 Encounters:  09/30/22 166 lb 12.8 oz (75.7 kg)  05/19/22 161 lb 6.4 oz (73.2 kg)  02/11/22 166 lb 14.4 oz (75.7 kg)    Physical Exam Vitals and nursing note reviewed.  Constitutional:      Appearance: Normal appearance. He is well-developed.  HENT:     Head: Normocephalic and atraumatic.  Eyes:     Pupils: Pupils are equal, round, and reactive to light.  Cardiovascular:     Rate and Rhythm: Normal rate and regular rhythm.     Pulses: Normal pulses.     Heart sounds: Normal heart sounds.  Pulmonary:     Effort:  Pulmonary effort is normal.     Breath sounds: Normal breath sounds.  Abdominal:     Palpations: Abdomen is soft.  Musculoskeletal:        General: Normal range of motion.     Cervical back: Normal range of motion and neck supple.  Lymphadenopathy:     Cervical: No cervical adenopathy.  Skin:    General: Skin is warm and dry.     Capillary Refill: Capillary refill takes less than 2 seconds.  Neurological:     General: No focal deficit present.     Mental Status: He is alert and oriented to person, place, and time.  Psychiatric:        Mood and Affect: Mood normal.        Behavior: Behavior normal.        Thought Content: Thought content normal.        Judgment: Judgment normal.      Assessment & Plan    1. Other fatigue Check testosterone level with fasting blood work. Treat deficiency if indicated   2. Episode of moderate major depression ([place]) Reduce dosing prozac to 20 mg and will have patient gradually reduce dosing as tolerated.  - FLUoxetine (PROZAC) 10 MG capsule; Take 1-2 capsules (10-20 mg total) by mouth daily.  Dispense: 180 capsule; Refill: 1  3. Primary insomnia May continue insomnia 10 mg at bedtime as needed. New prescription sent to pharmacy.  - zolpidem (AMBIEN) 10 MG tablet; Take 1 tablet (10 mg total) by mouth at bedtime as needed for sleep.  Dispense: 30 tablet; Refill: 2   Problem List Items Addressed This Visit       Other   Fatigue - Primary   Primary insomnia   Relevant Medications   zolpidem (AMBIEN) 10 MG tablet   Episode of moderate major depression (HCC)   Relevant Medications   FLUoxetine (PROZAC) 10 MG capsule     Return in about 4 months (around 01/29/2023) for mood. decrease prozac - FBW with testosterone and b12 check in few days/weeks .         Ronnell Freshwater, NP  [facility] at [facility] [phone] (phone) [phone] (fax)  [place]

## 2022-10-06 ENCOUNTER — Other Ambulatory Visit: Payer: Self-pay

## 2022-10-06 DIAGNOSIS — Z0000 Encounter for general adult medical examination without abnormal findings: Secondary | ICD-10-CM

## 2022-10-06 DIAGNOSIS — Z130 Encounter for screening for diseases of the blood and blood-forming organs and certain disorders involving the immune mechanism: Secondary | ICD-10-CM

## 2022-10-06 DIAGNOSIS — E291 Testicular hypofunction: Secondary | ICD-10-CM

## 2022-10-07 ENCOUNTER — Other Ambulatory Visit: Payer: BC Managed Care – PPO

## 2022-10-07 DIAGNOSIS — Z130 Encounter for screening for diseases of the blood and blood-forming organs and certain disorders involving the immune mechanism: Secondary | ICD-10-CM

## 2022-10-07 DIAGNOSIS — Z13228 Encounter for screening for other metabolic disorders: Secondary | ICD-10-CM | POA: Diagnosis not present

## 2022-10-07 DIAGNOSIS — Z1321 Encounter for screening for nutritional disorder: Secondary | ICD-10-CM | POA: Diagnosis not present

## 2022-10-07 DIAGNOSIS — E291 Testicular hypofunction: Secondary | ICD-10-CM | POA: Diagnosis not present

## 2022-10-07 DIAGNOSIS — Z1329 Encounter for screening for other suspected endocrine disorder: Secondary | ICD-10-CM | POA: Diagnosis not present

## 2022-10-07 DIAGNOSIS — Z0000 Encounter for general adult medical examination without abnormal findings: Secondary | ICD-10-CM | POA: Diagnosis not present

## 2022-10-08 LAB — COMPREHENSIVE METABOLIC PANEL
ALT: 10 IU/L (ref 0–44)
AST: 16 IU/L (ref 0–40)
Albumin/Globulin Ratio: 2.4 — ABNORMAL HIGH (ref 1.2–2.2)
Albumin: 4.7 g/dL (ref 4.1–5.1)
Alkaline Phosphatase: 74 IU/L (ref 44–121)
BUN/Creatinine Ratio: 18 (ref 9–20)
BUN: 17 mg/dL (ref 6–20)
Bilirubin Total: 0.3 mg/dL (ref 0.0–1.2)
CO2: 24 mmol/L (ref 20–29)
Calcium: 9.2 mg/dL (ref 8.7–10.2)
Chloride: 105 mmol/L (ref 96–106)
Creatinine, Ser: 0.96 mg/dL (ref 0.76–1.27)
Globulin, Total: 2 g/dL (ref 1.5–4.5)
Glucose: 98 mg/dL (ref 70–99)
Potassium: 4.8 mmol/L (ref 3.5–5.2)
Sodium: 141 mmol/L (ref 134–144)
Total Protein: 6.7 g/dL (ref 6.0–8.5)
eGFR: 104 mL/min/{1.73_m2} (ref >59)

## 2022-10-08 LAB — LIPID PANEL
Chol/HDL Ratio: 3.6 ratio (ref 0.0–5.0)
Cholesterol, Total: 198 mg/dL (ref 100–199)
HDL: 55 mg/dL (ref >39)
LDL Chol Calc (NIH): 132 mg/dL — ABNORMAL HIGH (ref 0–99)
Triglycerides: 58 mg/dL (ref 0–149)
VLDL Cholesterol Cal: 11 mg/dL (ref 5–40)

## 2022-10-08 LAB — CBC
Hematocrit: 40.8 % (ref 37.5–51.0)
Hemoglobin: 13.8 g/dL (ref 13.0–17.7)
MCH: 29.3 pg (ref 26.6–33.0)
MCHC: 33.8 g/dL (ref 31.5–35.7)
MCV: 87 fL (ref 79–97)
Platelets: 274 10*3/uL (ref 150–450)
RBC: 4.71 x10E6/uL (ref 4.14–5.80)
RDW: 12 % (ref 11.6–15.4)
WBC: 6.4 10*3/uL (ref 3.4–10.8)

## 2022-10-08 LAB — TSH: TSH: 1.77 u[IU]/mL (ref 0.450–4.500)

## 2022-10-08 LAB — HEMOGLOBIN A1C
Est. average glucose Bld gHb Est-mCnc: 103 mg/dL
Hgb A1c MFr Bld: 5.2 % (ref 4.8–5.6)

## 2022-10-08 LAB — TESTOSTERONE: Testosterone: 467 ng/dL (ref 264–916)

## 2022-10-09 ENCOUNTER — Encounter: Payer: Self-pay | Admitting: Nurse Practitioner

## 2022-10-09 NOTE — Progress Notes (Signed)
Labs good. Patient notified

## 2022-11-19 DIAGNOSIS — R41840 Attention and concentration deficit: Secondary | ICD-10-CM | POA: Diagnosis not present

## 2022-11-19 DIAGNOSIS — Z79891 Long term (current) use of opiate analgesic: Secondary | ICD-10-CM | POA: Diagnosis not present

## 2022-11-19 DIAGNOSIS — Z79899 Other long term (current) drug therapy: Secondary | ICD-10-CM | POA: Diagnosis not present

## 2022-11-19 DIAGNOSIS — Z5181 Encounter for therapeutic drug level monitoring: Secondary | ICD-10-CM | POA: Diagnosis not present

## 2022-11-26 DIAGNOSIS — F902 Attention-deficit hyperactivity disorder, combined type: Secondary | ICD-10-CM | POA: Diagnosis not present

## 2022-11-26 DIAGNOSIS — Z79899 Other long term (current) drug therapy: Secondary | ICD-10-CM | POA: Diagnosis not present

## 2022-12-23 DIAGNOSIS — F902 Attention-deficit hyperactivity disorder, combined type: Secondary | ICD-10-CM | POA: Diagnosis not present

## 2022-12-23 DIAGNOSIS — Z5181 Encounter for therapeutic drug level monitoring: Secondary | ICD-10-CM | POA: Diagnosis not present

## 2022-12-23 DIAGNOSIS — Z79891 Long term (current) use of opiate analgesic: Secondary | ICD-10-CM | POA: Diagnosis not present

## 2022-12-23 DIAGNOSIS — Z79899 Other long term (current) drug therapy: Secondary | ICD-10-CM | POA: Diagnosis not present

## 2022-12-24 DIAGNOSIS — F902 Attention-deficit hyperactivity disorder, combined type: Secondary | ICD-10-CM | POA: Diagnosis not present

## 2022-12-24 DIAGNOSIS — Z79899 Other long term (current) drug therapy: Secondary | ICD-10-CM | POA: Diagnosis not present

## 2022-12-29 ENCOUNTER — Other Ambulatory Visit: Payer: Self-pay | Admitting: Nurse Practitioner

## 2022-12-29 ENCOUNTER — Encounter: Payer: Self-pay | Admitting: Nurse Practitioner

## 2022-12-29 DIAGNOSIS — F5101 Primary insomnia: Secondary | ICD-10-CM

## 2022-12-29 MED ORDER — ZOLPIDEM TARTRATE 10 MG PO TABS: 10.0000 mg | ORAL_TABLET | Freq: Every evening | ORAL | 1 refills | Status: DC | PRN

## 2023-01-07 DIAGNOSIS — M25521 Pain in right elbow: Secondary | ICD-10-CM | POA: Diagnosis not present

## 2023-02-03 ENCOUNTER — Ambulatory Visit: Payer: BC Managed Care – PPO | Admitting: Nurse Practitioner

## 2023-02-03 ENCOUNTER — Encounter: Payer: Self-pay | Admitting: Nurse Practitioner

## 2023-02-03 VITALS — BP 135/86 | HR 82 | Ht 70.87 in | Wt 160.1 lb

## 2023-02-03 DIAGNOSIS — F411 Generalized anxiety disorder: Secondary | ICD-10-CM | POA: Diagnosis not present

## 2023-02-03 DIAGNOSIS — F5101 Primary insomnia: Secondary | ICD-10-CM | POA: Diagnosis not present

## 2023-02-03 DIAGNOSIS — R41840 Attention and concentration deficit: Secondary | ICD-10-CM

## 2023-02-03 MED ORDER — ZOLPIDEM TARTRATE 10 MG PO TABS: 10.0000 mg | ORAL_TABLET | Freq: Every evening | ORAL | 1 refills | Status: DC | PRN

## 2023-02-03 NOTE — Progress Notes (Signed)
Established patient visit   Patient: Evan Lee   DOB: [birth year]   37 y.o. Male  MRN: [id number] Visit Date: 02/03/2023   Chief Complaint  Patient presents with   Medical Management of Chronic Issues   Subjective    HPI  Follow up  ADD -formal testing done per [facility] -taking adderall XL when needed  Has really helped him with work focus and time management.  -states that he is worried less and no longer feeling depressed.  -was able to come off SSRI. --wll continue to follow up with attention specialist   --insomnia -currently taking ambien as needed to help him sleep  -reviewed PDMP profile  -accidental overdose risk score is above average at 380.  -there are no red flags or state indicators.  -fill history is appropriate.   -He denies chest pain, chest pressure, or shortness of breath. He denies headaches or visual disturbances. He denies abdominal pain, nausea, vomiting, or changes in bowel or bladder habits.    Medications: Outpatient Medications Prior to Visit  Medication Sig   amphetamine-dextroamphetamine (ADDERALL XR) 20 MG 24 hr capsule Take 20 mg by mouth daily.   [DISCONTINUED] zolpidem (AMBIEN) 10 MG tablet Take 1 tablet (10 mg total) by mouth at bedtime as needed for sleep.   [DISCONTINUED] FLUoxetine (PROZAC) 10 MG capsule Take 1-2 capsules (10-20 mg total) by mouth daily.   No facility-administered medications prior to visit.    Review of Systems See HPI    Last CBC Lab Results  Component Value Date   WBC 6.4 10/07/2022   HGB 13.8 10/07/2022   HCT 40.8 10/07/2022   MCV 87 10/07/2022   MCH 29.3 10/07/2022   RDW 12.0 10/07/2022   PLT 274 10/07/2022   Last metabolic panel Lab Results  Component Value Date   GLUCOSE 98 10/07/2022   NA 141 10/07/2022   K 4.8 10/07/2022   CL 105 10/07/2022   CO2 24 10/07/2022   BUN 17 10/07/2022   CREATININE 0.96 10/07/2022   EGFR 104 10/07/2022   CALCIUM 9.2 10/07/2022    PHOS 3.9 03/31/2016   PROT 6.7 10/07/2022   ALBUMIN 4.7 10/07/2022   LABGLOB 2.0 10/07/2022   AGRATIO 2.4 (H) 10/07/2022   BILITOT 0.3 10/07/2022   ALKPHOS 74 10/07/2022   AST 16 10/07/2022   ALT 10 10/07/2022   Last lipids Lab Results  Component Value Date   CHOL 198 10/07/2022   HDL 55 10/07/2022   LDLCALC 132 (H) 10/07/2022   TRIG 58 10/07/2022   CHOLHDL 3.6 10/07/2022   Last hemoglobin A1c Lab Results  Component Value Date   HGBA1C 5.2 10/07/2022   Last thyroid functions Lab Results  Component Value Date   TSH 1.770 10/07/2022       Objective     Today's Vitals   02/03/23 1548  BP: 135/86  Pulse: 82  SpO2: 100%  Weight: 160 lb 1.9 oz (72.6 kg)  Height: 5' 10.87" (1.8 m)   Body mass index is 22.41 kg/m.  BP Readings from Last 3 Encounters:  02/03/23 135/86  09/30/22 127/77  05/19/22 121/73    Wt Readings from Last 3 Encounters:  02/03/23 160 lb 1.9 oz (72.6 kg)  09/30/22 166 lb 12.8 oz (75.7 kg)  05/19/22 161 lb 6.4 oz (73.2 kg)    Physical Exam Vitals and nursing note reviewed.  Constitutional:      Appearance: Normal appearance. He is well-developed.  HENT:     Head:  Normocephalic and atraumatic.     Nose: Nose normal.     Mouth/Throat:     Mouth: Mucous membranes are moist.     Pharynx: Oropharynx is clear.  Eyes:     Extraocular Movements: Extraocular movements intact.     Conjunctiva/sclera: Conjunctivae normal.     Pupils: Pupils are equal, round, and reactive to light.  Neck:     Vascular: No carotid bruit.  Cardiovascular:     Rate and Rhythm: Normal rate and regular rhythm.     Pulses: Normal pulses.     Heart sounds: Normal heart sounds.  Pulmonary:     Effort: Pulmonary effort is normal.     Breath sounds: Normal breath sounds.  Abdominal:     Palpations: Abdomen is soft.  Musculoskeletal:        General: Normal range of motion.     Cervical back: Normal range of motion and neck supple.  Lymphadenopathy:      Cervical: No cervical adenopathy.  Skin:    General: Skin is warm and dry.     Capillary Refill: Capillary refill takes less than 2 seconds.  Neurological:     General: No focal deficit present.     Mental Status: He is alert and oriented to person, place, and time.  Psychiatric:        Mood and Affect: Mood normal.        Behavior: Behavior normal.        Thought Content: Thought content normal.        Judgment: Judgment normal.      Assessment & Plan    Primary insomnia Assessment & Plan: Patient may continue taking Ambien 10 mg at bedtime as needed. -New prescription sent to pharmacy today.  Orders: -     Zolpidem Tartrate; Take 1 tablet (10 mg total) by mouth at bedtime as needed for sleep.  Dispense: 30 tablet; Refill: 1  Generalized anxiety disorder Assessment & Plan: Improved and stable.  Patient reports being able to come off SSRI and anxiolytics. -Continue to monitor.   Attention and concentration deficit Assessment & Plan: Patient now taking Adderall XL. -Continue regular visits with [facility] as scheduled.      Return in about 4 months (around 06/06/2023) for mood, med refills.         Carlean Jews, NP  [facility] at [facility] [phone] (phone) [phone] (fax)  [facility]

## 2023-02-22 NOTE — Assessment & Plan Note (Signed)
Improved and stable.  Patient reports being able to come off SSRI and anxiolytics. -Continue to monitor.

## 2023-02-22 NOTE — Assessment & Plan Note (Signed)
Patient now taking Adderall XL. -Continue regular visits with [facility] as scheduled.

## 2023-02-22 NOTE — Assessment & Plan Note (Signed)
Patient may continue taking Ambien 10 mg at bedtime as needed. -New prescription sent to pharmacy today.

## 2023-02-26 DIAGNOSIS — M25561 Pain in right knee: Secondary | ICD-10-CM | POA: Diagnosis not present

## 2023-03-13 DIAGNOSIS — M25561 Pain in right knee: Secondary | ICD-10-CM | POA: Diagnosis not present

## 2023-03-24 DIAGNOSIS — S83241S Other tear of medial meniscus, current injury, right knee, sequela: Secondary | ICD-10-CM | POA: Diagnosis not present

## 2023-03-24 DIAGNOSIS — M25561 Pain in right knee: Secondary | ICD-10-CM | POA: Diagnosis not present

## 2023-03-25 DIAGNOSIS — F902 Attention-deficit hyperactivity disorder, combined type: Secondary | ICD-10-CM | POA: Diagnosis not present

## 2023-03-25 DIAGNOSIS — Z79899 Other long term (current) drug therapy: Secondary | ICD-10-CM | POA: Diagnosis not present

## 2023-04-08 DIAGNOSIS — S83241A Other tear of medial meniscus, current injury, right knee, initial encounter: Secondary | ICD-10-CM | POA: Diagnosis not present

## 2023-04-08 DIAGNOSIS — S83231A Complex tear of medial meniscus, current injury, right knee, initial encounter: Secondary | ICD-10-CM | POA: Diagnosis not present

## 2023-04-08 DIAGNOSIS — M6751 Plica syndrome, right knee: Secondary | ICD-10-CM | POA: Diagnosis not present

## 2023-04-08 HISTORY — PX: MENISCUS REPAIR: SHX5179

## 2023-04-15 DIAGNOSIS — M25561 Pain in right knee: Secondary | ICD-10-CM | POA: Diagnosis not present

## 2023-04-20 DIAGNOSIS — M25561 Pain in right knee: Secondary | ICD-10-CM | POA: Diagnosis not present

## 2023-04-24 DIAGNOSIS — M25561 Pain in right knee: Secondary | ICD-10-CM | POA: Diagnosis not present

## 2023-04-28 DIAGNOSIS — M25561 Pain in right knee: Secondary | ICD-10-CM | POA: Diagnosis not present

## 2023-05-01 DIAGNOSIS — M25561 Pain in right knee: Secondary | ICD-10-CM | POA: Diagnosis not present

## 2023-05-05 DIAGNOSIS — M25561 Pain in right knee: Secondary | ICD-10-CM | POA: Diagnosis not present

## 2023-05-13 ENCOUNTER — Other Ambulatory Visit: Payer: Self-pay | Admitting: Nurse Practitioner

## 2023-05-13 ENCOUNTER — Encounter: Payer: Self-pay | Admitting: Family Medicine

## 2023-05-13 ENCOUNTER — Encounter: Payer: Self-pay | Admitting: Nurse Practitioner

## 2023-05-13 DIAGNOSIS — F5101 Primary insomnia: Secondary | ICD-10-CM

## 2023-05-13 DIAGNOSIS — M25561 Pain in right knee: Secondary | ICD-10-CM | POA: Diagnosis not present

## 2023-05-13 MED ORDER — ZOLPIDEM TARTRATE 10 MG PO TABS: 10.0000 mg | ORAL_TABLET | Freq: Every evening | ORAL | 1 refills | Status: DC | PRN

## 2023-06-08 ENCOUNTER — Encounter: Payer: Self-pay | Admitting: Family Medicine

## 2023-06-08 ENCOUNTER — Ambulatory Visit: Payer: BC Managed Care – PPO | Admitting: Family Medicine

## 2023-06-08 ENCOUNTER — Ambulatory Visit: Payer: BC Managed Care – PPO | Admitting: Nurse Practitioner

## 2023-06-19 ENCOUNTER — Encounter: Payer: Self-pay | Admitting: Family Medicine

## 2023-06-19 ENCOUNTER — Ambulatory Visit: Payer: BC Managed Care – PPO | Admitting: Family Medicine

## 2023-06-19 VITALS — BP 123/78 | HR 87 | Resp 20 | Ht 70.87 in | Wt 154.0 lb

## 2023-06-19 DIAGNOSIS — Z1159 Encounter for screening for other viral diseases: Secondary | ICD-10-CM | POA: Diagnosis not present

## 2023-06-19 DIAGNOSIS — Z1329 Encounter for screening for other suspected endocrine disorder: Secondary | ICD-10-CM

## 2023-06-19 DIAGNOSIS — R030 Elevated blood-pressure reading, without diagnosis of hypertension: Secondary | ICD-10-CM | POA: Insufficient documentation

## 2023-06-19 DIAGNOSIS — F5101 Primary insomnia: Secondary | ICD-10-CM | POA: Diagnosis not present

## 2023-06-19 DIAGNOSIS — R41840 Attention and concentration deficit: Secondary | ICD-10-CM

## 2023-06-19 DIAGNOSIS — Z131 Encounter for screening for diabetes mellitus: Secondary | ICD-10-CM

## 2023-06-19 DIAGNOSIS — Z1321 Encounter for screening for nutritional disorder: Secondary | ICD-10-CM

## 2023-06-19 DIAGNOSIS — Z136 Encounter for screening for cardiovascular disorders: Secondary | ICD-10-CM

## 2023-06-19 NOTE — Progress Notes (Signed)
Established Patient Office Visit  Subjective   Patient ID: Evan Lee, male    DOB: 1986-01-12  Age: 37 y.o. MRN: [id number]  Chief Complaint  Patient presents with   Anxiety   Depression    HPI Evan Lee is a 37 y.o. male presenting today for follow up of sleep, ADHD, mood. Insomnia: He struggles with sleep induction. He is taking Ambien about 4 nights a week, particularly when he changes which shift he is working.  ADHD: Reports symptoms are well controlled on Adderall 15 mg as prescribed by [facility]. Denies any problems with insomnia, chest pain, palpitations, or SOB.   Mood: Patient is here to follow up for depression and anxiety.  As of May 2024, he was able to discontinue SSRI and anxiolytic medications.     06/19/2023   11:05 AM 02/03/2023    3:50 PM 09/30/2022    2:37 PM  Depression screen PHQ 2/9  Decreased Interest 0 0 1  Down, Depressed, Hopeless 0 0 1  PHQ - 2 Score 0 0 2  Altered sleeping 0 0 1  Tired, decreased energy 0 1 2  Change in appetite 0 0 0  Feeling bad or failure about yourself  0 0 1  Trouble concentrating 0 0 2  Moving slowly or fidgety/restless 0 0 2  Suicidal thoughts 0 0 0  PHQ-9 Score 0 1 10  Difficult doing work/chores Not difficult at all Not difficult at all        09/30/2022    2:37 PM 05/19/2022    3:43 PM 02/11/2022    8:40 AM 01/15/2022   10:41 AM  GAD 7 : Generalized Anxiety Score  Nervous, Anxious, on Edge 0 1 1 0  Control/stop worrying 0 2 1 0  Worry too much - different things 1 2 1 2   Trouble relaxing 1 2 1 2   Restless 1 1 1 2   Easily annoyed or irritable 1 1 1 1   Afraid - awful might happen 0 0 0 0  Total GAD 7 Score 4 9 6 7    Outpatient Medications Prior to Visit  Medication Sig   amphetamine-dextroamphetamine (ADDERALL XR) 15 MG 24 hr capsule Take 15 mg by mouth every morning.   zolpidem (AMBIEN) 10 MG tablet Take 1 tablet (10 mg total) by mouth at bedtime as needed for  sleep.   [DISCONTINUED] amphetamine-dextroamphetamine (ADDERALL XR) 20 MG 24 hr capsule Take 20 mg by mouth daily.   No facility-administered medications prior to visit.    ROS Negative unless otherwise noted in HPI   Objective:     BP 123/78 (BP Location: Left Arm, Patient Position: Sitting, Cuff Size: Normal)   Pulse 87   Resp 20   Ht 5' 10.87" (1.8 m)   Wt 154 lb (69.9 kg)   SpO2 99%   BMI 21.56 kg/m   Physical Exam Constitutional:      General: He is not in acute distress.    Appearance: Normal appearance.  HENT:     Head: Normocephalic and atraumatic.  Cardiovascular:     Rate and Rhythm: Normal rate and regular rhythm.     Heart sounds: Normal heart sounds. No murmur heard.    No friction rub. No gallop.  Pulmonary:     Effort: Pulmonary effort is normal. No respiratory distress.     Breath sounds: Normal breath sounds. No wheezing, rhonchi or rales.  Skin:    General: Skin is warm and  dry.  Neurological:     Mental Status: He is alert and oriented to person, place, and time.  Psychiatric:        Mood and Affect: Mood normal.     Assessment & Plan:  Attention and concentration deficit Assessment & Plan: Stable, well-controlled.  Continue regular visits with [facility] as scheduled.   Elevated blood pressure reading Assessment & Plan: Patient has had elevated blood pressure readings in the office frequently over the past 1 to 2 years.  Start with ambulatory blood pressure reading, keep log for next 2 weeks.  Return in 2 weeks to nurse visit and bring blood pressure log.  If elevated consistently in both settings, meets diagnosis criteria for hypertension and will treat accordingly.   Primary insomnia Assessment & Plan: Continue Ambien 10 mg at bedtime as needed for sleep.  We discussed that if he does need to use it more frequently than he is now, we may consider switching to a better long-term alternative.  Patient verbalized  understanding and is agreeable to this plan.    Return in 2 weeks for nurse blood pressure check and bring pressure log. Return in about 4 months (around 10/20/2023) for annual physical, fasting blood work 1 week before.    Melida Quitter, PA

## 2023-06-19 NOTE — Assessment & Plan Note (Signed)
Continue Ambien 10 mg at bedtime as needed for sleep.  We discussed that if he does need to use it more frequently than he is now, we may consider switching to a better long-term alternative.  Patient verbalized understanding and is agreeable to this plan.

## 2023-06-19 NOTE — Assessment & Plan Note (Signed)
Stable, well-controlled.  Continue regular visits with [facility] as scheduled.

## 2023-06-19 NOTE — Assessment & Plan Note (Signed)
Patient has had elevated blood pressure readings in the office frequently over the past 1 to 2 years.  Start with ambulatory blood pressure reading, keep log for next 2 weeks.  Return in 2 weeks to nurse visit and bring blood pressure log.  If elevated consistently in both settings, meets diagnosis criteria for hypertension and will treat accordingly.

## 2023-06-19 NOTE — Patient Instructions (Signed)
Check your blood pressure 3 times a day for the next 2 weeks and keep a log.  Bring the log to your nurse visit in 2 weeks for [place] to look at.  I am hoping that your blood pressure will be good at home based on your second reading here.

## 2023-07-03 ENCOUNTER — Ambulatory Visit: Payer: BC Managed Care – PPO

## 2023-07-08 DIAGNOSIS — F902 Attention-deficit hyperactivity disorder, combined type: Secondary | ICD-10-CM | POA: Diagnosis not present

## 2023-07-08 DIAGNOSIS — Z79899 Other long term (current) drug therapy: Secondary | ICD-10-CM | POA: Diagnosis not present

## 2023-07-09 DIAGNOSIS — F902 Attention-deficit hyperactivity disorder, combined type: Secondary | ICD-10-CM | POA: Diagnosis not present

## 2023-07-16 ENCOUNTER — Other Ambulatory Visit: Payer: Self-pay | Admitting: Family Medicine

## 2023-07-16 DIAGNOSIS — F5101 Primary insomnia: Secondary | ICD-10-CM

## 2023-09-14 ENCOUNTER — Other Ambulatory Visit: Payer: Self-pay | Admitting: Family Medicine

## 2023-09-14 DIAGNOSIS — F5101 Primary insomnia: Secondary | ICD-10-CM

## 2023-09-23 ENCOUNTER — Encounter: Payer: Self-pay | Admitting: Emergency Medicine

## 2023-09-23 ENCOUNTER — Ambulatory Visit
Admission: EM | Admit: 2023-09-23 | Discharge: 2023-09-23 | Disposition: A | Discharge status: Home / Self Care | Payer: BC Managed Care – PPO

## 2023-09-23 DIAGNOSIS — H93A2 Pulsatile tinnitus, left ear: Secondary | ICD-10-CM | POA: Diagnosis not present

## 2023-09-23 DIAGNOSIS — J329 Chronic sinusitis, unspecified: Secondary | ICD-10-CM | POA: Diagnosis not present

## 2023-09-23 DIAGNOSIS — H9202 Otalgia, left ear: Secondary | ICD-10-CM | POA: Diagnosis not present

## 2023-09-23 MED ORDER — PREDNISONE 20 MG PO TABS
40.0000 mg | ORAL_TABLET | Freq: Every day | ORAL | 0 refills | Status: AC
Start: 2023-09-23 — End: 2023-09-28

## 2023-09-23 NOTE — Discharge Instructions (Signed)
 Continue allergy medication including Nasacort .  Start prednisone  40 mg for 5 days.  Do not take NSAIDs with this medication including aspirin, ibuprofen /Advil , naproxen /Aleve .  You can use Mucinex, Tylenol , nasal saline/sinus rinses.  If your symptoms are not improving quickly please follow-up with ENT for further evaluation and management as we discussed.  Call them to schedule an appointment.  If you have any worsening symptoms including drainage from the ear, high fever, severe headache, nausea, vomiting you should be seen immediately.

## 2023-09-23 NOTE — ED Provider Notes (Signed)
 [facility]    CSN: [id number] Arrival date & time: 09/23/23  1917      History   Chief Complaint Chief Complaint  Patient presents with   Otalgia    HPI Evan Lee is a 38 y.o. male.   Patient presents today with 3-day history of left otalgia.  He describes this as a throbbing sensation with periodic pulsatile tinnitus.  He denies any recent swimming or airplane travel.  He does occasionally use Q-tips but does not use earbuds or earplugs on a regular basis.  He denies any recent illness but does have chronic sinus congestion and has been using Nasacort  as well as sinus lavage without improvement of symptoms.  He has not seen an ENT.  He does report a itching sensation in his ear.  He denies any recent antibiotics.  He has not seen an ENT recently.  He reports pain is rated 5 on a 0-10 pain scale, described as throbbing, no aggravating or alleviating factors identified.    Past Medical History:  Diagnosis Date   Anxiety    Phreesia 11/06/2020   PONV (postoperative nausea and vomiting)    TMJ arthralgia 2014   + bruxism per his dentist    Patient Active Problem List   Diagnosis Date Noted   Elevated blood pressure reading 06/19/2023   Episode of moderate major depression (HCC) 06/01/2022   Attention and concentration deficit 02/17/2022   Congenital pectus excavatum 01/15/2022   Chronic eczematous otitis externa of left ear 11/17/2021   Primary insomnia 11/17/2021   Local reaction to hymenoptera sting 09/24/2021   Other adverse food reactions, not elsewhere classified, subsequent encounter 09/24/2021   Irritable bowel syndrome with both constipation and diarrhea 07/04/2021   Other allergic rhinitis 07/04/2021   Restless legs syndrome 02/24/2021   Inguinal hernia of left side without obstruction or gangrene 10/26/2018   Entrapment of right ulnar nerve 07/02/2018   Numbness 07/02/2018   Bronchitis 03/01/2018   Myalgia 10/13/2017   Generalized  anxiety disorder 03/30/2016   Environmental and seasonal allergies 02/07/2016   Herpes genitalis in men 01/03/2013   TMJ arthralgia 01/03/2013    Past Surgical History:  Procedure Laterality Date   ANTERIOR CRUCIATE LIGAMENT REPAIR  09/08/2000   HERNIA REPAIR N/A    Phreesia 11/06/2020   INGUINAL HERNIA REPAIR Left 12/08/2018   Procedure: OPEN LEFT INGUINAL HERNIA REPAIR WITH MESH;  Surgeon: Adalberto Acton, MD;  Location: [facility];  Service: General;  Laterality: Left;   TYMPANOSTOMY TUBE PLACEMENT         Home Medications    Prior to Admission medications   Medication Sig Start Date End Date Taking? Authorizing Provider  amphetamine-dextroamphetamine (ADDERALL XR) 15 MG 24 hr capsule Take 15 mg by mouth every morning.   Yes [facility]  predniSONE  (DELTASONE ) 20 MG tablet Take 2 tablets (40 mg total) by mouth daily for 5 days. 09/23/23 09/28/23 Yes Angelmarie Ponzo K, PA-C  zolpidem  (AMBIEN ) 10 MG tablet TAKE 1 TABLET BY MOUTH EVERY DAY AT BEDTIME AS NEEDED FOR SLEEP 09/14/23  Yes Maryclare Smoke A, PA  gabapentin  (NEURONTIN ) 300 MG capsule Take 1 capsule (300 mg total) by mouth 3 (three) times daily. Patient not taking: Reported on 08/06/2020 12/08/18 08/06/20  Adalberto Acton, MD    Family History Family History  Problem Relation Age of Onset   Eczema Father    Hypertension Father    Diabetes Maternal Grandmother  Arthritis Other    Hypertension Other    Cancer Neg Hx    Early death Neg Hx    Drug abuse Neg Hx    Heart disease Neg Hx    Hyperlipidemia Neg Hx    Kidney disease Neg Hx    Stroke Neg Hx     Social History Social History   Tobacco Use   Smoking status: Former    Current packs/day: 0.05    Average packs/day: 0.1 packs/day for 1 year (0.1 ttl pk-yrs)    Types: Cigarettes    Passive exposure: Never   Smokeless tobacco: Never  Vaping Use   Vaping status: Never Used  Substance Use Topics   Alcohol  use: Yes    Alcohol/week: 5.0 standard drinks of alcohol    Types: 5 Cans of beer per week   Drug use: Yes    Types: Marijuana    Comment: occasional     Allergies   Patient has no known allergies.   Review of Systems Review of Systems  Constitutional:  Positive for activity change. Negative for appetite change, fatigue and fever.  HENT:  Positive for congestion and ear pain. Negative for facial swelling, sinus pressure, sneezing and sore throat.   Respiratory:  Negative for cough and shortness of breath.   Cardiovascular:  Negative for chest pain.  Gastrointestinal:  Negative for abdominal pain, diarrhea, nausea and vomiting.     Physical Exam Triage Vital Signs ED Triage Vitals  Encounter Vitals Group     BP 09/23/23 1943 (!) 152/94     Systolic BP Percentile --      Diastolic BP Percentile --      Pulse Rate 09/23/23 1943 90     Resp 09/23/23 1943 16     Temp 09/23/23 1943 98.5 F (36.9 C)     Temp Source 09/23/23 1943 Oral     SpO2 09/23/23 1943 99 %     Weight --      Height --      Head Circumference --      Peak Flow --      Pain Score 09/23/23 1944 5     Pain Loc --      Pain Education --      Exclude from Growth Chart --    No data found.  Updated Vital Signs BP (!) 152/94 (BP Location: Left Arm) Comment: pt reports he has been tracking higher with bp, PCP aware and monitoring per pt  Pulse 90   Temp 98.5 F (36.9 C) (Oral)   Resp 16   SpO2 99%   Visual Acuity Right Eye Distance:   Left Eye Distance:   Bilateral Distance:    Right Eye Near:   Left Eye Near:    Bilateral Near:     Physical Exam Vitals reviewed.  Constitutional:      General: He is awake.     Appearance: Normal appearance. He is well-developed. He is not ill-appearing.     Comments: Very pleasant male appears stated age in no acute distress sitting comfortably in exam room  HENT:     Head: Normocephalic and atraumatic.     Right Ear: Tympanic membrane, ear canal and  external ear normal. Tympanic membrane is not erythematous or bulging.     Left Ear: Ear canal and external ear normal. Tympanic membrane is retracted. Tympanic membrane is not erythematous or bulging.     Nose: Nose normal.     Mouth/Throat:  Pharynx: Uvula midline. No oropharyngeal exudate, posterior oropharyngeal erythema or uvula swelling.  Cardiovascular:     Rate and Rhythm: Normal rate and regular rhythm.     Heart sounds: Normal heart sounds, S1 normal and S2 normal. No murmur heard. Pulmonary:     Effort: Pulmonary effort is normal. No accessory muscle usage or respiratory distress.     Breath sounds: Normal breath sounds. No stridor. No wheezing, rhonchi or rales.     Comments: Clear to auscultation bilaterally Neurological:     Mental Status: He is alert.  Psychiatric:        Behavior: Behavior is cooperative.      UC Treatments / Results  Labs (all labs ordered are listed, but only abnormal results are displayed) Labs Reviewed - No data to display  EKG   Radiology No results found.  Procedures Procedures (including critical care time)  Medications Ordered in UC Medications - No data to display  Initial Impression / Assessment and Plan / UC Course  I have reviewed the triage vital signs and the nursing notes.  Pertinent labs & imaging results that were available during my care of the patient were reviewed by me and considered in my medical decision making (see chart for details).     Patient is well-appearing, afebrile, nontoxic, nontachycardic.  Viral testing was deferred as he has been symptomatic with sinus congestion for several months and this would not change management.  No evidence of acute infection on physical exam that would warrant initiation of antibiotics.  I suspect symptoms are related to eustachian tube dysfunction.  He was encouraged to continue allergy medication including Nasacort .  Will also start prednisone  40 mg for 5 days to help with  sinus congestion and hopefully eustachian tube dysfunction.  He was instructed not to take NSAIDs with this medication due to risk of GI bleeding.  He can use over-the-counter medication including Mucinex, Tylenol , nasal saline/sinus rinses.  Recommended close follow-up with ENT if his symptoms do not improving as he may need imaging if pulsatile tinnitus does not resolve.  We discussed that if he has any worsening or changing symptoms including persistent/worsening symptoms, severe headache, nausea, vomiting you need to be seen immediately.  Final Clinical Impressions(s) / UC Diagnoses   Final diagnoses:  Acute otalgia, left  Chronic congestion of paranasal sinus  Pulsatile tinnitus of left ear     Discharge Instructions      Continue allergy medication including Nasacort .  Start prednisone  40 mg for 5 days.  Do not take NSAIDs with this medication including aspirin, ibuprofen /Advil , naproxen /Aleve .  You can use Mucinex, Tylenol , nasal saline/sinus rinses.  If your symptoms are not improving quickly please follow-up with ENT for further evaluation and management as we discussed.  Call them to schedule an appointment.  If you have any worsening symptoms including drainage from the ear, high fever, severe headache, nausea, vomiting you should be seen immediately.     ED Prescriptions     Medication Sig Dispense Auth. Provider   predniSONE  (DELTASONE ) 20 MG tablet Take 2 tablets (40 mg total) by mouth daily for 5 days. 10 tablet Jonhatan Hearty K, PA-C      PDMP not reviewed this encounter.   Budd Cargo, PA-C 09/23/23 2052

## 2023-09-23 NOTE — ED Triage Notes (Signed)
 Pt reports throbbing left ear pain x3 days. States it feels like an ear infection or sinus infection may be coming on. Pt also reports ongoing postnasal drainage x2 months. No relief with tylenol  at home.

## 2023-10-13 ENCOUNTER — Other Ambulatory Visit: Payer: BC Managed Care – PPO

## 2023-10-13 DIAGNOSIS — Z136 Encounter for screening for cardiovascular disorders: Secondary | ICD-10-CM

## 2023-10-13 DIAGNOSIS — Z131 Encounter for screening for diabetes mellitus: Secondary | ICD-10-CM

## 2023-10-13 DIAGNOSIS — Z1321 Encounter for screening for nutritional disorder: Secondary | ICD-10-CM

## 2023-10-13 DIAGNOSIS — F5101 Primary insomnia: Secondary | ICD-10-CM

## 2023-10-13 DIAGNOSIS — R41840 Attention and concentration deficit: Secondary | ICD-10-CM

## 2023-10-13 DIAGNOSIS — Z1159 Encounter for screening for other viral diseases: Secondary | ICD-10-CM

## 2023-10-13 DIAGNOSIS — Z1329 Encounter for screening for other suspected endocrine disorder: Secondary | ICD-10-CM

## 2023-10-14 ENCOUNTER — Encounter: Payer: Self-pay | Admitting: Family Medicine

## 2023-10-14 LAB — VITAMIN D 25 HYDROXY (VIT D DEFICIENCY, FRACTURES): Vit D, 25-Hydroxy: 17.3 ng/mL — ABNORMAL LOW (ref 30.0–100.0)

## 2023-10-14 LAB — COMPREHENSIVE METABOLIC PANEL
ALT: 15 [IU]/L (ref 0–44)
AST: 19 [IU]/L (ref 0–40)
Albumin: 4.7 g/dL (ref 4.1–5.1)
Alkaline Phosphatase: 102 [IU]/L (ref 44–121)
BUN/Creatinine Ratio: 13 (ref 9–20)
BUN: 14 mg/dL (ref 6–20)
Bilirubin Total: 0.3 mg/dL (ref 0.0–1.2)
CO2: 24 mmol/L (ref 20–29)
Calcium: 9.6 mg/dL (ref 8.7–10.2)
Chloride: 103 mmol/L (ref 96–106)
Creatinine, Ser: 1.1 mg/dL (ref 0.76–1.27)
Globulin, Total: 1.9 g/dL (ref 1.5–4.5)
Glucose: 94 mg/dL (ref 70–99)
Potassium: 4.3 mmol/L (ref 3.5–5.2)
Sodium: 142 mmol/L (ref 134–144)
Total Protein: 6.6 g/dL (ref 6.0–8.5)
eGFR: 88 mL/min/{1.73_m2} (ref >59)

## 2023-10-14 LAB — CBC WITH DIFFERENTIAL/PLATELET
Basophils Absolute: 0.1 10*3/uL (ref 0.0–0.2)
Basos: 1 %
EOS (ABSOLUTE): 0.1 10*3/uL (ref 0.0–0.4)
Eos: 2 %
Hematocrit: 42.4 % (ref 37.5–51.0)
Hemoglobin: 14.4 g/dL (ref 13.0–17.7)
Immature Grans (Abs): 0 10*3/uL (ref 0.0–0.1)
Immature Granulocytes: 0 %
Lymphocytes Absolute: 1.9 10*3/uL (ref 0.7–3.1)
Lymphs: 24 %
MCH: 29.7 pg (ref 26.6–33.0)
MCHC: 34 g/dL (ref 31.5–35.7)
MCV: 87 fL (ref 79–97)
Monocytes Absolute: 0.5 10*3/uL (ref 0.1–0.9)
Monocytes: 6 %
Neutrophils Absolute: 5.3 10*3/uL (ref 1.4–7.0)
Neutrophils: 67 %
Platelets: 315 10*3/uL (ref 150–450)
RBC: 4.85 x10E6/uL (ref 4.14–5.80)
RDW: 12.1 % (ref 11.6–15.4)
WBC: 7.8 10*3/uL (ref 3.4–10.8)

## 2023-10-14 LAB — LIPID PANEL
Chol/HDL Ratio: 2.5 {ratio} (ref 0.0–5.0)
Cholesterol, Total: 169 mg/dL (ref 100–199)
HDL: 67 mg/dL (ref >39)
LDL Chol Calc (NIH): 84 mg/dL (ref 0–99)
Triglycerides: 101 mg/dL (ref 0–149)
VLDL Cholesterol Cal: 18 mg/dL (ref 5–40)

## 2023-10-14 LAB — HIV ANTIBODY (ROUTINE TESTING W REFLEX): HIV Screen 4th Generation wRfx: NONREACTIVE

## 2023-10-14 LAB — HEMOGLOBIN A1C
Est. average glucose Bld gHb Est-mCnc: 105 mg/dL
Hgb A1c MFr Bld: 5.3 % (ref 4.8–5.6)

## 2023-10-14 LAB — HEPATITIS C ANTIBODY: Hep C Virus Ab: NONREACTIVE

## 2023-10-14 LAB — TSH RFX ON ABNORMAL TO FREE T4: TSH: 2.55 u[IU]/mL (ref 0.450–4.500)

## 2023-10-15 ENCOUNTER — Encounter: Payer: Self-pay | Admitting: Family Medicine

## 2023-10-16 ENCOUNTER — Encounter (HOSPITAL_BASED_OUTPATIENT_CLINIC_OR_DEPARTMENT_OTHER): Payer: Self-pay

## 2023-10-20 ENCOUNTER — Encounter: Payer: BC Managed Care – PPO | Admitting: Family Medicine

## 2023-10-29 DIAGNOSIS — J301 Allergic rhinitis due to pollen: Secondary | ICD-10-CM | POA: Insufficient documentation

## 2023-10-29 DIAGNOSIS — H93A2 Pulsatile tinnitus, left ear: Secondary | ICD-10-CM | POA: Insufficient documentation

## 2023-10-29 DIAGNOSIS — H9313 Tinnitus, bilateral: Secondary | ICD-10-CM | POA: Insufficient documentation

## 2023-10-29 DIAGNOSIS — H903 Sensorineural hearing loss, bilateral: Secondary | ICD-10-CM | POA: Insufficient documentation

## 2023-11-27 ENCOUNTER — Encounter: Payer: Self-pay | Admitting: Emergency Medicine

## 2023-11-27 ENCOUNTER — Ambulatory Visit
Admission: EM | Admit: 2023-11-27 | Discharge: 2023-11-27 | Disposition: A | Discharge status: Home / Self Care | Attending: Internal Medicine | Admitting: Internal Medicine

## 2023-11-27 DIAGNOSIS — Z20818 Contact with and (suspected) exposure to other bacterial communicable diseases: Secondary | ICD-10-CM | POA: Insufficient documentation

## 2023-11-27 DIAGNOSIS — J029 Acute pharyngitis, unspecified: Secondary | ICD-10-CM | POA: Diagnosis not present

## 2023-11-27 LAB — POC COVID19/FLU A&B COMBO
Covid Antigen, POC: NEGATIVE
Influenza A Antigen, POC: NEGATIVE
Influenza B Antigen, POC: NEGATIVE

## 2023-11-27 LAB — POCT RAPID STREP A (OFFICE): Rapid Strep A Screen: NEGATIVE

## 2023-11-27 MED ORDER — AMOXICILLIN 500 MG PO CAPS: 500.0000 mg | ORAL_CAPSULE | Freq: Two times a day (BID) | ORAL | 0 refills | Status: AC

## 2023-11-27 NOTE — Discharge Instructions (Signed)
 Rapid flu and COVID are negative.  Strep is negative as well but as we discussed I am suspicious that you have strep throat given your exposure.  Therefore, I have sent you an antibiotic while culture is pending.

## 2023-11-27 NOTE — ED Provider Notes (Signed)
 [facility]    CSN: [id number] Arrival date & time: 11/27/23  4098      History   Chief Complaint Chief Complaint  Patient presents with   Sore Throat   Nasal Congestion    HPI Evan Lee is a 38 y.o. male.   Patient presents with sore throat, nasal congestion, cough, fatigue, headache that started yesterday.  Reports his daughter tested positive for strep throat recently.  Denies any fever at home.  Reports that he has had bodyaches.  Patient states that the sore throat is his most severe symptom.  He has not used any medications for symptoms.   Sore Throat    Past Medical History:  Diagnosis Date   Anxiety    [employer] 11/06/2020   PONV (postoperative nausea and vomiting)    TMJ arthralgia 2014   + bruxism per his dentist    Patient Active Problem List   Diagnosis Date Noted   Pulsatile tinnitus, left ear 10/29/2023   Seasonal allergic rhinitis due to pollen 10/29/2023   Sensorineural hearing loss (SNHL) of both ears 10/29/2023   Subjective tinnitus of both ears 10/29/2023   Elevated blood pressure reading 06/19/2023   Episode of moderate major depression (HCC) 06/01/2022   Attention and concentration deficit 02/17/2022   Congenital pectus excavatum 01/15/2022   Chronic eczematous otitis externa of left ear 11/17/2021   Primary insomnia 11/17/2021   Local reaction to hymenoptera sting 09/24/2021   Other adverse food reactions, not elsewhere classified, subsequent encounter 09/24/2021   Irritable bowel syndrome with both constipation and diarrhea 07/04/2021   Other allergic rhinitis 07/04/2021   Restless legs syndrome 02/24/2021   Inguinal hernia of left side without obstruction or gangrene 10/26/2018   Entrapment of right ulnar nerve 07/02/2018   Numbness 07/02/2018   Bronchitis 03/01/2018   Myalgia 10/13/2017   Generalized anxiety disorder 03/30/2016   Environmental and seasonal allergies 02/07/2016   Herpes genitalis in men  01/03/2013   TMJ (temporomandibular joint syndrome) 01/03/2013    Past Surgical History:  Procedure Laterality Date   ANTERIOR CRUCIATE LIGAMENT REPAIR  09/08/2000   HERNIA REPAIR N/A    [employer]reesia 11/06/2020   INGUINAL HERNIA REPAIR Left 12/08/2018   Procedure: OPEN LEFT INGUINAL HERNIA REPAIR WITH MESH;  Surgeon: Berna Bue, MD;  Location: [facility];  Service: General;  Laterality: Left;   MENISCUS REPAIR Right 04/08/2023   TYMPANOSTOMY TUBE PLACEMENT         Home Medications    Prior to Admission medications   Medication Sig Start Date End Date Taking? Authorizing Provider  amoxicillin (AMOXIL) 500 MG capsule Take 1 capsule (500 mg total) by mouth 2 (two) times daily for 10 days. 11/27/23 12/07/23 Yes Torah Pinnock, Acie Fredrickson, FNP  amphetamine-dextroamphetamine (ADDERALL XR) 15 MG 24 hr capsule Take 15 mg by mouth every morning.   Yes [facility]  zolpidem (AMBIEN) 10 MG tablet TAKE 1 TABLET BY MOUTH EVERY DAY AT BEDTIME AS NEEDED FOR SLEEP 09/14/23  Yes Saralyn Pilar A, PA  amphetamine-dextroamphetamine (ADDERALL XR) 15 MG 24 hr capsule Take by mouth.    [provider]  ipratropium (ATROVENT) 0.06 % nasal spray Place into the nose. Patient not taking: Reported on 11/27/2023 10/29/23   [provider]  gabapentin (NEURONTIN) 300 MG capsule Take 1 capsule (300 mg total) by mouth 3 (three) times daily. Patient not taking: Reported on 08/06/2020 12/08/18 08/06/20  Berna Bue, MD    Family  History Family History  Problem Relation Age of Onset   Eczema Father    Hypertension Father    Diabetes Maternal Grandmother    Arthritis Other    Hypertension Other    Cancer Neg Hx    Early death Neg Hx    Drug abuse Neg Hx    Heart disease Neg Hx    Hyperlipidemia Neg Hx    Kidney disease Neg Hx    Stroke Neg Hx     Social History Social History   Tobacco Use   Smoking status: Former    Current packs/day:  0.05    Average packs/day: 0.1 packs/day for 1 year (0.1 ttl pk-yrs)    Types: Cigarettes    Passive exposure: Never   Smokeless tobacco: Never  Vaping Use   Vaping status: Never Used  Substance Use Topics   Alcohol use: Not Currently    Alcohol/week: 5.0 standard drinks of alcohol    Types: 5 Cans of beer per week    Comment: social   Drug use: Yes    Types: Marijuana    Comment: occasional     Allergies   Patient has no known allergies.   Review of Systems Review of Systems Per HPI  Physical Exam Triage Vital Signs ED Triage Vitals [11/27/23 0951]  Encounter Vitals Group     BP (!) 141/92     Systolic BP Percentile      Diastolic BP Percentile      Pulse Rate 85     Resp 16     Temp 97.8 F (36.6 C)     Temp Source Oral     SpO2 98 %     Weight      Height      Head Circumference      Peak Flow      Pain Score 10     Pain Loc      Pain Education      Exclude from Growth Chart    No data found.  Updated Vital Signs BP 127/87 (BP Location: Left Arm)   Pulse 85   Temp 97.8 F (36.6 C) (Oral)   Resp 16   SpO2 98%   Visual Acuity Right Eye Distance:   Left Eye Distance:   Bilateral Distance:    Right Eye Near:   Left Eye Near:    Bilateral Near:     Physical Exam Constitutional:      General: He is not in acute distress.    Appearance: Normal appearance. He is not toxic-appearing or diaphoretic.  HENT:     Head: Normocephalic and atraumatic.     Right Ear: Tympanic membrane and ear canal normal.     Left Ear: Tympanic membrane and ear canal normal.     Nose: Congestion present.     Mouth/Throat:     Mouth: Mucous membranes are moist.     Pharynx: Posterior oropharyngeal erythema present.  Eyes:     Extraocular Movements: Extraocular movements intact.     Conjunctiva/sclera: Conjunctivae normal.     Pupils: Pupils are equal, round, and reactive to light.  Cardiovascular:     Rate and Rhythm: Normal rate and regular rhythm.     Pulses:  Normal pulses.     Heart sounds: Normal heart sounds.  Pulmonary:     Effort: Pulmonary effort is normal. No respiratory distress.     Breath sounds: Normal breath sounds. No wheezing.  Musculoskeletal:  General: Normal range of motion.     Cervical back: Normal range of motion.  Skin:    General: Skin is warm and dry.  Neurological:     General: No focal deficit present.     Mental Status: He is alert and oriented to person, place, and time. Mental status is at baseline.  Psychiatric:        Mood and Affect: Mood normal.        Behavior: Behavior normal.      UC Treatments / Results  Labs (all labs ordered are listed, but only abnormal results are displayed) Labs Reviewed  POCT RAPID STREP A (OFFICE) - Normal  POC COVID19/FLU A&B COMBO - Normal  CULTURE, GROUP A STREP [facility])    EKG   Radiology No results found.  Procedures Procedures (including critical care time)  Medications Ordered in UC Medications - No data to display  Initial Impression / Assessment and Plan / UC Course  I have reviewed the triage vital signs and the nursing notes.  Pertinent labs & imaging results that were available during my care of the patient were reviewed by me and considered in my medical decision making (see chart for details).     Rapid strep test was negative but given appearance of posterior pharynx on exam and exposure to strep throat, I am still highly suspicious of strep throat.  Therefore, will opt to treat with amoxicillin antibiotic.  Rapid COVID and flu were negative.  Throat culture pending.  Discussed supportive care, fluids, rest.  Advised strict follow-up precautions.  Patient verbalized understanding and was agreeable with plan. Final Clinical Impressions(s) / UC Diagnoses   Final diagnoses:  Sore throat     Discharge Instructions      Rapid flu and COVID are negative.  Strep is negative as well but as we discussed I am suspicious that you have strep  throat given your exposure.  Therefore, I have sent you an antibiotic while culture is pending.     ED Prescriptions     Medication Sig Dispense Auth. Provider   amoxicillin (AMOXIL) 500 MG capsule Take 1 capsule (500 mg total) by mouth 2 (two) times daily for 10 days. 20 capsule Gustavus Bryant, [place]      PDMP not reviewed this encounter.   Gustavus Bryant, [place] 11/27/23 1147

## 2023-11-27 NOTE — ED Triage Notes (Signed)
 Pt reports sore throat, nasal congestion, dry cough, fatigue, low back pain, and headache. Denies fevers and chills. Sore throat is worst symptom - having a hard time sleeping and swallowing from pain. No med use at home for symptoms.

## 2023-11-29 LAB — CULTURE, GROUP A STREP (THRC)

## 2024-03-11 IMAGING — CT CT CHEST W/ CM
1 series · 15 of 34 positions shown, 19 images · IV contrast (agent unspecified)
Comparison: None Available.

CLINICAL DATA: Chest wall pain, nontraumatic, no prior imaging

EXAM:
CT CHEST WITH CONTRAST
TECHNIQUE: Multidetector CT imaging of the chest was performed during
intravenous contrast administration.

[Series 2: chest with 2mm st · axial · 0.69mm/px · z∈[-312,-14]mm · 15 of 177 slices shown, 19 images]
[im 14/177  mediastinal]
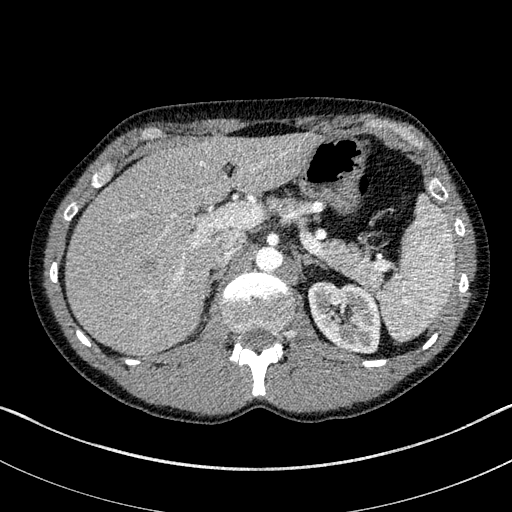
[im 14/177  lung]
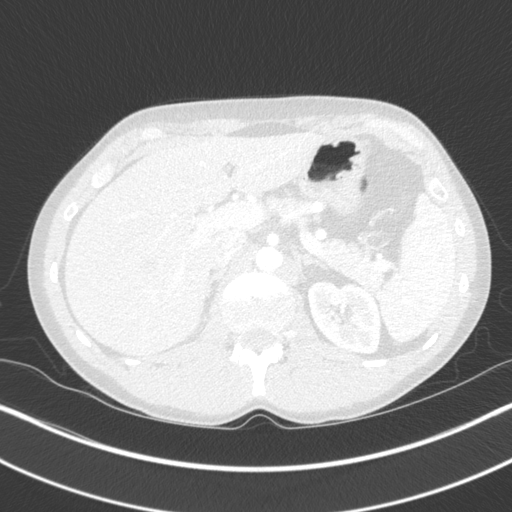
[im 27/177  lung]
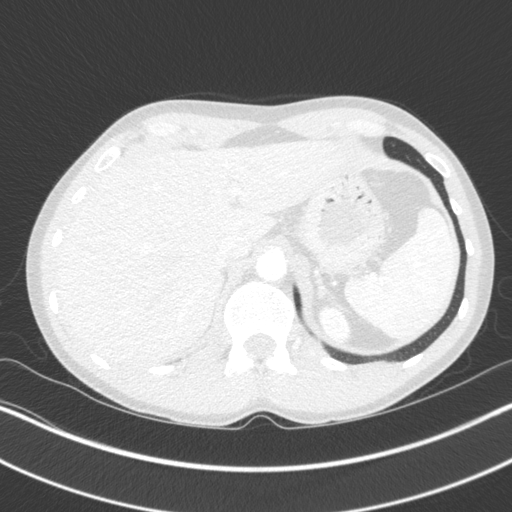
[im 36/177  lung]
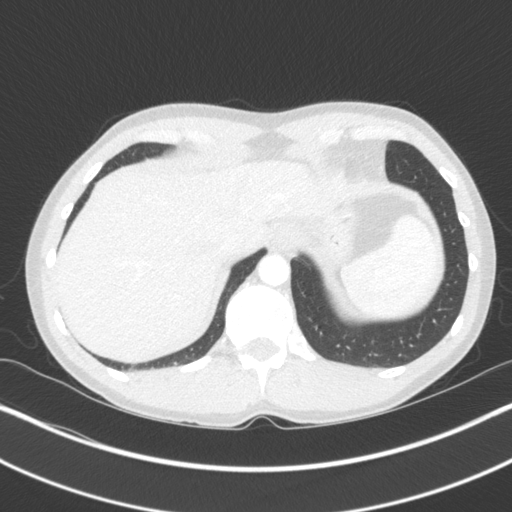
[im 46/177  lung]
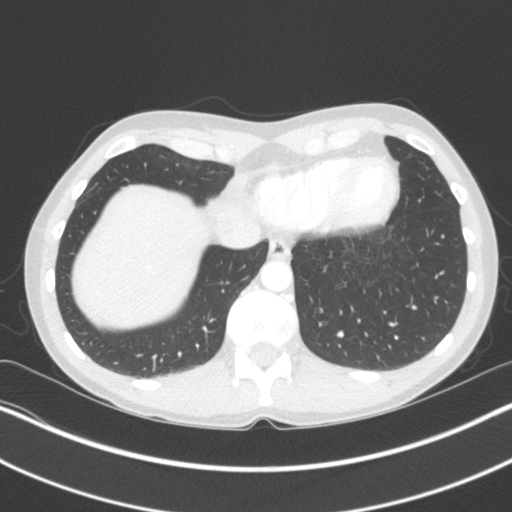
[im 59/177  mediastinal]
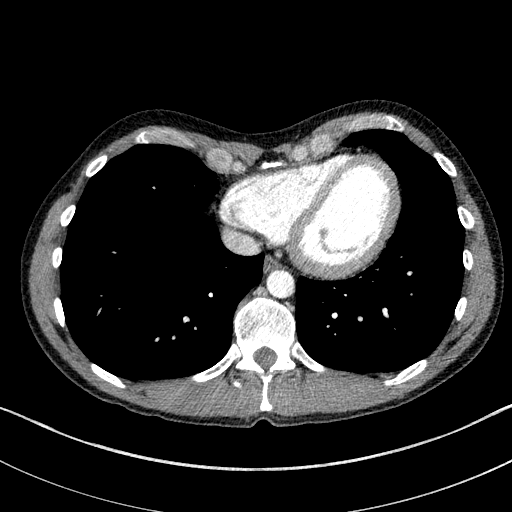
[im 59/177  lung]
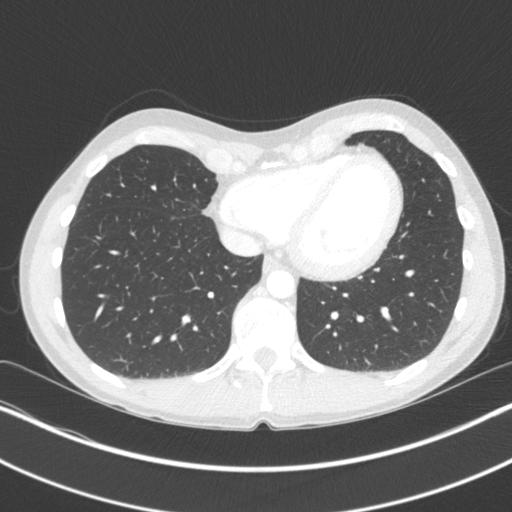
[im 71/177  lung]
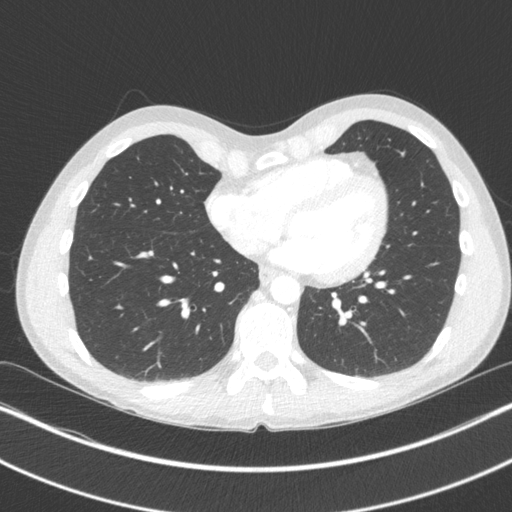
[im 79/177  lung]
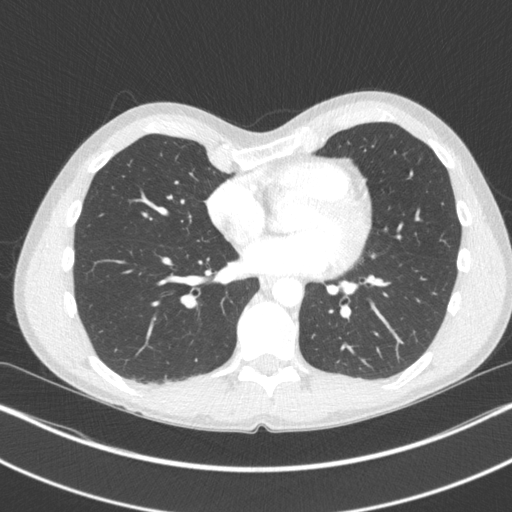
[im 92/177  lung]
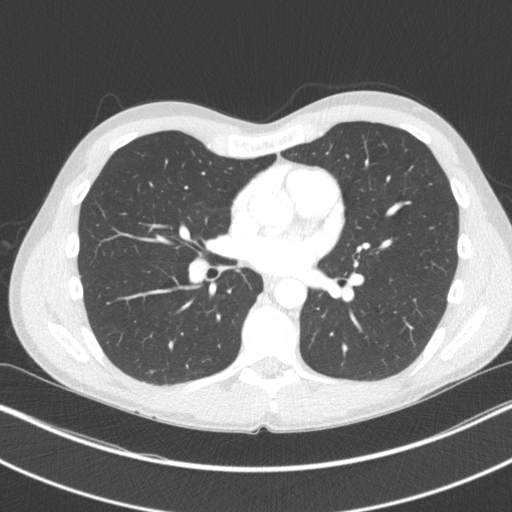
[im 98/177  mediastinal]
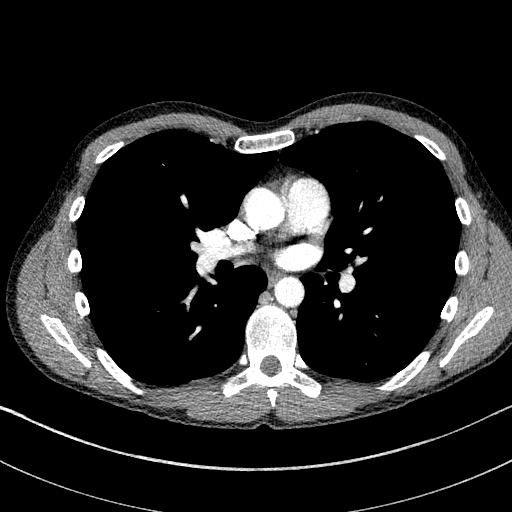
[im 98/177  lung]
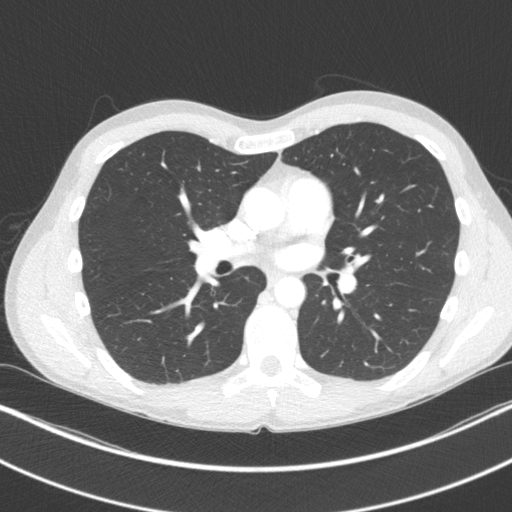
[im 106/177  lung]
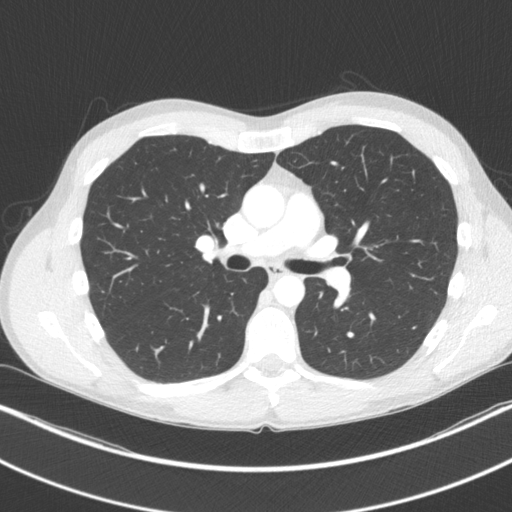
[im 118/177  lung]
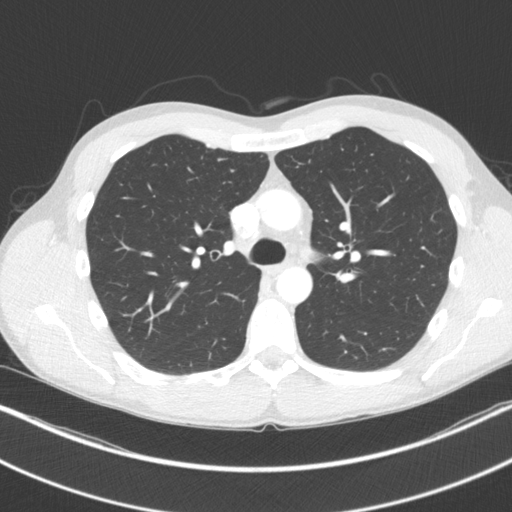
[im 131/177  lung]
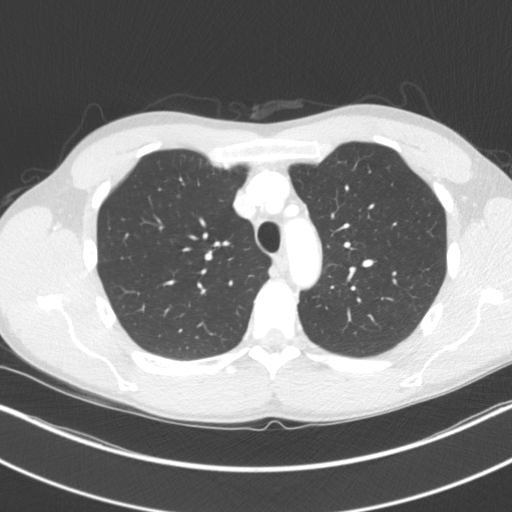
[im 141/177  mediastinal]
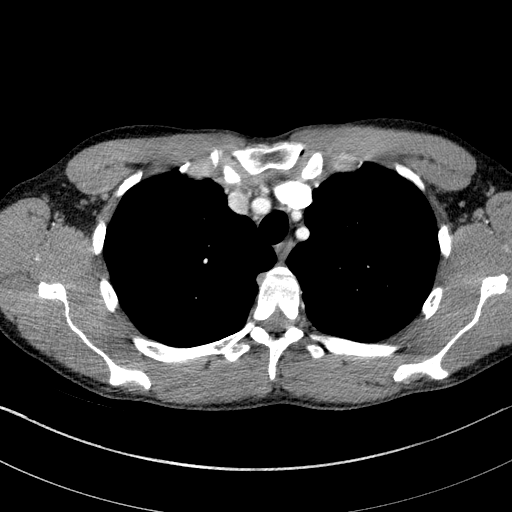
[im 141/177  lung]
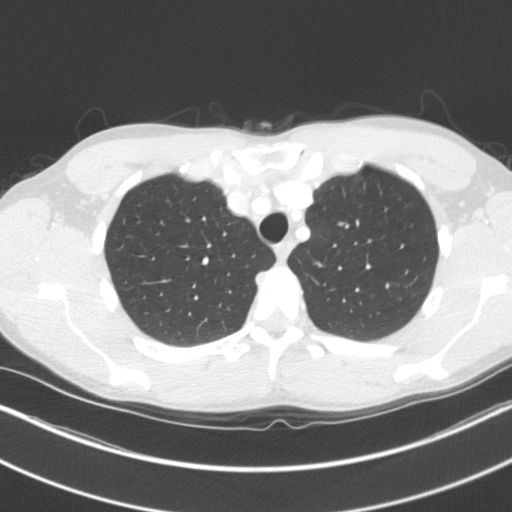
[im 150/177  lung]
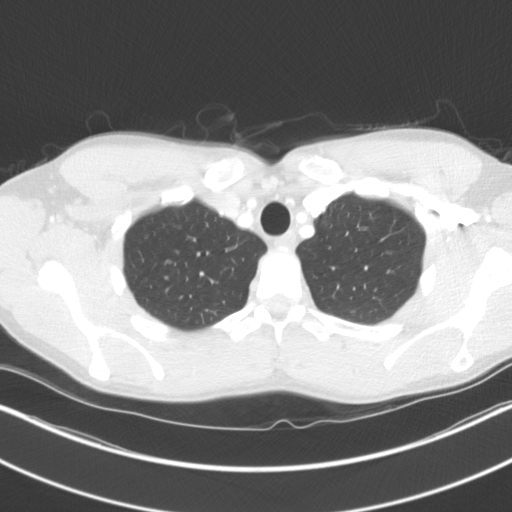
[im 163/177  lung]
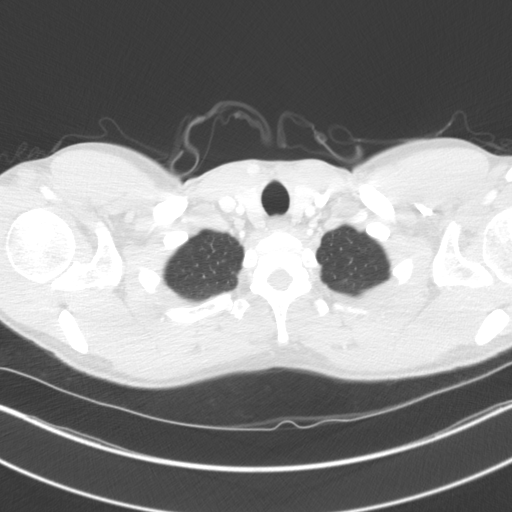

[15 of 34 positions shown; findings below may reference images not displayed]

RADIATION DOSE REDUCTION: This exam was performed according to the
departmental dose-optimization program which includes automated
exposure control, adjustment of the mA and/or kV according to
patient size and/or use of iterative reconstruction technique.

CONTRAST:  75mL SA643C-WJJ IOPAMIDOL (SA643C-WJJ) INJECTION 61%
FINDINGS: Cardiovascular: No significant vascular findings. Normal heart size.
No pericardial effusion.

Mediastinum/Nodes: No enlarged mediastinal, hilar, or axillary lymph
nodes. Thyroid gland, trachea, and esophagus demonstrate no
significant findings.

Lungs/Pleura: Lungs are clear. No pleural effusion or pneumothorax.

Upper Abdomen: No acute abnormality.

Musculoskeletal: Mild pectus excavatum deformity. No acute bone
abnormality.
IMPRESSION: No acute intrathoracic pathology identified. No definite
radiographic explanation for the patient's reported symptoms.

Mild pectus excavatum deformity.
